# Patient Record
Sex: Female | Born: 1990 | Race: Black or African American | Hispanic: No | Marital: Single | Smoking: Current every day smoker
Health system: Southern US, Community
[De-identification: ages and names within clinical notes are randomized; demographics above are authoritative.]

## PROBLEM LIST (undated history)

## (undated) ENCOUNTER — Ambulatory Visit

## (undated) DIAGNOSIS — F419 Anxiety disorder, unspecified: Secondary | ICD-10-CM

---

## 2008-04-02 ENCOUNTER — Emergency Department: Payer: Self-pay | Admitting: Emergency Medicine

## 2009-03-07 ENCOUNTER — Emergency Department: Payer: Self-pay | Admitting: Emergency Medicine

## 2009-05-18 ENCOUNTER — Emergency Department: Payer: Self-pay | Admitting: Internal Medicine

## 2009-07-14 ENCOUNTER — Ambulatory Visit: Payer: Self-pay | Admitting: Obstetrics & Gynecology

## 2009-12-28 ENCOUNTER — Inpatient Hospital Stay: Payer: Self-pay | Admitting: Obstetrics and Gynecology

## 2010-06-15 ENCOUNTER — Emergency Department: Payer: Self-pay | Admitting: Emergency Medicine

## 2011-02-07 ENCOUNTER — Ambulatory Visit: Payer: Self-pay | Admitting: Internal Medicine

## 2011-05-06 ENCOUNTER — Ambulatory Visit: Payer: Self-pay | Admitting: Internal Medicine

## 2011-09-12 ENCOUNTER — Ambulatory Visit: Payer: Self-pay

## 2011-09-13 ENCOUNTER — Ambulatory Visit: Payer: Self-pay | Admitting: Internal Medicine

## 2012-01-31 ENCOUNTER — Ambulatory Visit: Payer: Self-pay | Admitting: Internal Medicine

## 2012-02-01 ENCOUNTER — Ambulatory Visit: Payer: Self-pay | Admitting: Internal Medicine

## 2012-06-26 ENCOUNTER — Ambulatory Visit: Payer: Self-pay | Admitting: Internal Medicine

## 2012-09-17 ENCOUNTER — Ambulatory Visit: Payer: Self-pay

## 2014-02-07 ENCOUNTER — Emergency Department: Payer: Self-pay | Admitting: Emergency Medicine

## 2014-02-11 ENCOUNTER — Emergency Department: Payer: Self-pay | Admitting: Emergency Medicine

## 2014-03-21 ENCOUNTER — Emergency Department: Payer: Self-pay | Admitting: Emergency Medicine

## 2014-05-30 ENCOUNTER — Emergency Department: Payer: Self-pay | Admitting: Emergency Medicine

## 2014-08-25 ENCOUNTER — Emergency Department: Payer: Self-pay | Admitting: Emergency Medicine

## 2014-11-05 ENCOUNTER — Emergency Department: Payer: Self-pay | Admitting: Emergency Medicine

## 2014-11-22 ENCOUNTER — Emergency Department: Payer: Self-pay | Admitting: Emergency Medicine

## 2015-12-13 ENCOUNTER — Encounter: Payer: Self-pay | Admitting: Emergency Medicine

## 2015-12-13 ENCOUNTER — Emergency Department
Admission: EM | Admit: 2015-12-13 | Discharge: 2015-12-13 | Disposition: A | Discharge status: Home / Self Care | Payer: Self-pay | Attending: Emergency Medicine | Admitting: Emergency Medicine

## 2015-12-13 DIAGNOSIS — F17200 Nicotine dependence, unspecified, uncomplicated: Secondary | ICD-10-CM | POA: Insufficient documentation

## 2015-12-13 DIAGNOSIS — J039 Acute tonsillitis, unspecified: Secondary | ICD-10-CM | POA: Insufficient documentation

## 2015-12-13 DIAGNOSIS — R51 Headache: Secondary | ICD-10-CM | POA: Insufficient documentation

## 2015-12-13 LAB — CBC WITH DIFFERENTIAL/PLATELET
BASOS ABS: 0.1 10*3/uL (ref 0–0.1)
Basophils Relative: 1 %
EOS ABS: 0 10*3/uL (ref 0–0.7)
Eosinophils Relative: 0 %
HCT: 42.3 % (ref 35.0–47.0)
Hemoglobin: 14.3 g/dL (ref 12.0–16.0)
LYMPHS ABS: 1.4 10*3/uL (ref 1.0–3.6)
LYMPHS PCT: 8 %
MCH: 32.3 pg (ref 26.0–34.0)
MCHC: 33.7 g/dL (ref 32.0–36.0)
MCV: 96 fL (ref 80.0–100.0)
Monocytes Absolute: 1.3 10*3/uL — ABNORMAL HIGH (ref 0.2–0.9)
Monocytes Relative: 8 %
NEUTROS ABS: 14.5 10*3/uL — AB (ref 1.4–6.5)
NEUTROS PCT: 83 %
PLATELETS: 319 10*3/uL (ref 150–440)
RBC: 4.41 MIL/uL (ref 3.80–5.20)
RDW: 13.4 % (ref 11.5–14.5)
WBC: 17.3 10*3/uL — AB (ref 3.6–11.0)

## 2015-12-13 LAB — POCT RAPID STREP A: Streptococcus, Group A Screen (Direct): NEGATIVE

## 2015-12-13 LAB — MONONUCLEOSIS SCREEN: MONO SCREEN: NEGATIVE

## 2015-12-13 MED ORDER — AMOXICILLIN 500 MG PO CAPS: 500.0000 mg | ORAL_CAPSULE | Freq: Three times a day (TID) | ORAL | Status: DC

## 2015-12-13 MED ORDER — AMOXICILLIN 500 MG PO CAPS
500.0000 mg | ORAL_CAPSULE | Freq: Once | ORAL | Status: AC
  Administered 2015-12-13: 500 mg via ORAL
  Filled 2015-12-13: qty 1

## 2015-12-13 MED ORDER — ACETAMINOPHEN 500 MG PO TABS
1000.0000 mg | ORAL_TABLET | Freq: Once | ORAL | Status: AC
  Administered 2015-12-13: 1000 mg via ORAL
  Filled 2015-12-13: qty 2

## 2015-12-13 NOTE — Discharge Instructions (Signed)
Tonsillitis Tonsillitis is an infection of the throat. This infection causes the tonsils to become red, tender, and puffy (swollen). Tonsils are groups of tissue at the back of your throat. If bacteria caused your infection, antibiotic medicine will be given to you. Sometimes symptoms of tonsillitis can be relieved with the use of steroid medicine. If your tonsillitis is severe and happens often, you may need to get your tonsils removed (tonsillectomy). HOME CARE   Rest and sleep often.  Drink enough fluids to keep your pee (urine) clear or pale yellow.  While your throat is sore, eat soft or liquid foods like:  Soup.  Ice cream.  Instant breakfast drinks.  Eat frozen ice pops.  Gargle with a warm or cold liquid to help soothe the throat. Gargle with a water and salt mix. Mix 1/4 teaspoon of salt and 1/4 teaspoon of baking soda in 1 cup of water.  Only take medicines as told by your doctor.  If you are given medicines (antibiotics), take them as told. Finish them even if you start to feel better. GET HELP IF:  You have large, tender lumps in your neck.  You have a rash.  You cough up green, yellow-brown, or bloody fluid.  You cannot swallow liquids or food for 24 hours.  You notice that only one of your tonsils is swollen. GET HELP RIGHT AWAY IF:   You throw up (vomit).  You have a very bad headache.  You have a stiff neck.  You have chest pain.  You have trouble breathing or swallowing.  You have bad throat pain, drooling, or your voice changes.  You have bad pain not helped by medicine.  You cannot fully open your mouth.  You have redness, puffiness, or bad pain in the neck.  You have a fever. MAKE SURE YOU:   Understand these instructions.  Will watch your condition.  Will get help right away if you are not doing well or get worse.   This information is not intended to replace advice given to you by your health care provider. Make sure you discuss any  questions you have with your health care provider.   Document Released: 04/25/2008 Document Revised: 11/12/2013 Document Reviewed: 04/26/2013 Elsevier Interactive Patient Education [employer]    Take antibiotics until completely gone in 10 days. Tylenol if needed for headache or throat pain. Follow-up with Dr. Jenne Campus if any continued problems with your throat. You  will need to call and make an appointment.

## 2015-12-13 NOTE — ED Notes (Signed)
Pt comes into the ED via POV c/o sore throat and generalized body aches.  Denies any fevers due to not checking her temperature at home.  States she sees white patches on the back of her throat.

## 2015-12-13 NOTE — ED Notes (Signed)
DC instructions given. Explained follow up procedures with Dr. Jenne Campus. Also informed her that if her throat culture was positive she would be notified by phone otherwise if she did not hear from anyone then the culture is negative.  Explained importance of taking entire course of antibiotics.

## 2015-12-13 NOTE — ED Provider Notes (Signed)
[facility] Emergency Department Provider Note  ____________________________________________  Time seen: Approximately 3:17 PM  I have reviewed the triage vital signs and the nursing notes.   HISTORY  Chief Complaint Sore Throat   HPI Wendy Miles is a 25 y.o. female is here with complaint of sore throat and body aches. Patient was in aware of any fever at home. She states she has seen some white patches in the back of her throat. She is unaware of any exposure to strep throat. Appetite has decreased since she's been sick that still able to drink fluids. Currently she rates her throat pain is 10 out of 10.   History reviewed. No pertinent past medical history.  There are no active problems to display for this patient.   Past Surgical History  Procedure Laterality Date  . Cesarean section      Current Outpatient Rx  Name  Route  Sig  Dispense  Refill  . amoxicillin (AMOXIL) 500 MG capsule   Oral   Take 1 capsule (500 mg total) by mouth 3 (three) times daily.   30 capsule   0     Allergies Review of patient's allergies indicates no known allergies.  No family history on file.  Social History Social History  Substance Use Topics  . Smoking status: Current Every Day Smoker -- 0.50 packs/day  . Smokeless tobacco: None  . Alcohol Use: Yes     Comment: occasionally    Review of Systems Constitutional: Unaware fever/chills Eyes: No visual changes. ENT: Positive sore throat. Cardiovascular: Denies chest pain. Respiratory: Denies shortness of breath. Gastrointestinal:  No nausea, no vomiting.   Musculoskeletal: Negative for back pain. Skin: Negative for rash. Neurological: Positive for headaches, no focal weakness or numbness.  10-point ROS otherwise negative.  ____________________________________________   PHYSICAL EXAM:  VITAL SIGNS: ED Triage Vitals  Enc Vitals Group     BP --      Pulse --      Resp --      Temp --    Temp src --      SpO2 --      Weight --      Height --      Head Cir --      Peak Flow --      Pain Score 12/13/15 1505 10     Pain Loc --      Pain Edu? --      Excl. in GC? --     Constitutional: Alert and oriented. Well appearing and in no acute distress. Eyes: Conjunctivae are normal. PERRL. EOMI. Head: Atraumatic. Nose: No congestion/rhinnorhea. Mouth/Throat: Mucous membranes are moist.  Oropharynx erythematous with bilateral tonsillar exudate. Neck: No stridor.   Hematological/Lymphatic/Immunilogical: Moderate bilateral cervical lymphadenopathy. Cardiovascular: Normal rate, regular rhythm. Grossly normal heart sounds.  Good peripheral circulation. Respiratory: Normal respiratory effort.  No retractions. Lungs CTAB. Gastrointestinal: Soft and nontender. No distention.  Musculoskeletal: Moves upper and lower extremity is without difficulty. Neurologic:  Normal speech and language. No gross focal neurologic deficits are appreciated. No gait instability. Skin:  Skin is warm, dry and intact. No rash noted. Psychiatric: Mood and affect are normal. Speech and behavior are normal.  ____________________________________________   LABS (all labs ordered are listed, but only abnormal results are displayed)  Labs Reviewed  CBC WITH DIFFERENTIAL/PLATELET - Abnormal; Notable for the following:    WBC 17.3 (*)    Neutro Abs 14.5 (*)    Monocytes Absolute 1.3 (*)  All other components within normal limits  CULTURE, GROUP A STREP [facility])  MONONUCLEOSIS SCREEN  POCT RAPID STREP A    PROCEDURES  Procedure(s) performed: None  Critical Care performed: No  ____________________________________________   INITIAL IMPRESSION / ASSESSMENT AND PLAN / ED COURSE  Pertinent labs & imaging results that were available during my care of the patient were reviewed by me and considered in my medical decision making (see chart for details).  My chest was negative as well as strep. Patient  was placed on amoxicillin 500 mg 3 times a day for 10 days and given Tylenol in the emergency room which she had no difficulty swallowing. She'll follow-up with Dr. Jenne Campus if any continued problems. ____________________________________________   FINAL CLINICAL IMPRESSION(S) / ED DIAGNOSES  Final diagnoses:  Exudative tonsillitis      Tommi Rumps, PA-C 12/13/15 1723  Jene Every, MD 12/14/15 2040

## 2015-12-15 LAB — CULTURE, GROUP A STREP (THRC)

## 2016-08-01 ENCOUNTER — Emergency Department
Admission: EM | Admit: 2016-08-01 | Discharge: 2016-08-01 | Disposition: A | Discharge status: Home / Self Care | Payer: Self-pay | Attending: Student in an Organized Health Care Education/Training Program | Admitting: Student in an Organized Health Care Education/Training Program

## 2016-08-01 ENCOUNTER — Encounter: Payer: Self-pay | Admitting: Emergency Medicine

## 2016-08-01 DIAGNOSIS — B9689 Other specified bacterial agents as the cause of diseases classified elsewhere: Secondary | ICD-10-CM

## 2016-08-01 DIAGNOSIS — N760 Acute vaginitis: Secondary | ICD-10-CM | POA: Insufficient documentation

## 2016-08-01 DIAGNOSIS — F17200 Nicotine dependence, unspecified, uncomplicated: Secondary | ICD-10-CM | POA: Insufficient documentation

## 2016-08-01 LAB — CHLAMYDIA/NGC RT PCR (ARMC ONLY)
CHLAMYDIA TR: NOT DETECTED
N GONORRHOEAE: NOT DETECTED

## 2016-08-01 LAB — URINALYSIS COMPLETE WITH MICROSCOPIC (ARMC ONLY)
BACTERIA UA: NONE SEEN
BILIRUBIN URINE: NEGATIVE
GLUCOSE, UA: NEGATIVE mg/dL
HGB URINE DIPSTICK: NEGATIVE
Ketones, ur: NEGATIVE mg/dL
Nitrite: NEGATIVE
Protein, ur: NEGATIVE mg/dL
Specific Gravity, Urine: 1.016 (ref 1.005–1.030)
pH: 6 (ref 5.0–8.0)

## 2016-08-01 LAB — WET PREP, GENITAL
CLUE CELLS WET PREP: NONE SEEN
Sperm: NONE SEEN
Trich, Wet Prep: NONE SEEN
Yeast Wet Prep HPF POC: NONE SEEN

## 2016-08-01 LAB — POCT PREGNANCY, URINE: PREG TEST UR: NEGATIVE

## 2016-08-01 MED ORDER — FLUCONAZOLE 150 MG PO TABS: 150.0000 mg | ORAL_TABLET | Freq: Every day | ORAL | 0 refills | Status: DC

## 2016-08-01 MED ORDER — METRONIDAZOLE 500 MG PO TABS: 500.0000 mg | ORAL_TABLET | Freq: Three times a day (TID) | ORAL | 0 refills | Status: AC

## 2016-08-01 NOTE — Discharge Instructions (Signed)
Will call patient if GC culture is positive.

## 2016-08-01 NOTE — ED Triage Notes (Signed)
Patient presents to the ED with whitish discharge and vaginal discomfort.  Patient denies abdominal pain, denies vomiting, and diarrhea at this time.  Patient states she noticed discharge around 1 month ago and used monistat and found improvement but symptoms have returned.  Patient is in no obvious distress at his time.

## 2016-08-01 NOTE — ED Notes (Signed)
States she developed "deep" vaginal itching several weeks ago  Used Monostat with some relief but now sx's returned with some vaginal discharge

## 2016-08-01 NOTE — ED Provider Notes (Signed)
[facility] Emergency Department Provider Note   ____________________________________________   First MD Initiated Contact with Patient 08/01/16 [phone]     (approximate)  I have reviewed the triage vital signs and the nursing notes.   HISTORY  Chief Complaint Vaginal Discharge and Pelvic Pain    HPI Wendy Miles is a 25 y.o. female patient complaining of one month of whitish vaginal discharge. Patient also stated this vaginal discomfort. Patient denies any flank pain or fever. Patient states she is using over-the-counter Monistat and found mild improvement in symptoms return. Patient denies any high risk sexual activities.Patient rates her pain discomfort as a 2/10. No other palliative measures for this complaint.   History reviewed. No pertinent past medical history.  There are no active problems to display for this patient.   Past Surgical History:  Procedure Laterality Date  . CESAREAN SECTION      Prior to Admission medications   Medication Sig Start Date End Date Taking? Authorizing Provider  amoxicillin (AMOXIL) 500 MG capsule Take 1 capsule (500 mg total) by mouth 3 (three) times daily. 12/13/15   Tommi Rumps, PA-C  fluconazole (DIFLUCAN) 150 MG tablet Take 1 tablet (150 mg total) by mouth daily. 08/01/16   Joni Reining, PA-C  metroNIDAZOLE (FLAGYL) 500 MG tablet Take 1 tablet (500 mg total) by mouth 3 (three) times daily. 08/01/16 08/08/16  Joni Reining, PA-C    Allergies Review of patient's allergies indicates no known allergies.  No family history on file.  Social History Social History  Substance Use Topics  . Smoking status: Current Every Day Smoker    Packs/day: 0.50  . Smokeless tobacco: Never Used  . Alcohol use Yes     Comment: occasionally    Review of Systems Constitutional: No fever/chills Eyes: No visual changes. ENT: No sore throat. Cardiovascular: Denies chest pain. Respiratory: Denies shortness of  breath. Gastrointestinal: No abdominal pain.  No nausea, no vomiting.  No diarrhea.  No constipation. Genitourinary: Negative for dysuria. Musculoskeletal: Negative for back pain. Skin: Negative for rash. Neurological: Negative for headaches, focal weakness or numbness. ____________________________________________   PHYSICAL EXAM:  VITAL SIGNS: ED Triage Vitals [08/01/16 0724]  Enc Vitals Group     BP (!) 457/74     Pulse Rate 67     Resp 18     Temp 98.1 F (36.7 C)     Temp Source Oral     SpO2      Weight 205 lb (93 kg)     Height 5\' 4"  (1.626 m)     Head Circumference      Peak Flow      Pain Score 2     Pain Loc      Pain Edu?      Excl. in GC?     Constitutional: Alert and oriented. Well appearing and in no acute distress. Eyes: Conjunctivae are normal. PERRL. EOMI. Head: Atraumatic. Nose: No congestion/rhinnorhea. Mouth/Throat: Mucous membranes are moist.  Oropharynx non-erythematous. Neck: No stridor.  No cervical spine tenderness to palpation. Hematological/Lymphatic/Immunilogical: No cervical lymphadenopathy. Cardiovascular: Normal rate, regular rhythm. Grossly normal heart sounds.  Good peripheral circulation. Respiratory: Normal respiratory effort.  No retractions. Lungs CTAB. Gastrointestinal: Soft and nontender. No distention. No abdominal bruits. No CVA tenderness. Genitourinary: Chaperoned vaginal exam revealed copious amount of discharge. Patient had negative cervical tenderness with palpation. Musculoskeletal: No lower extremity tenderness nor edema.  No joint effusions. Neurologic:  Normal speech and language. No gross  focal neurologic deficits are appreciated. No gait instability. Skin:  Skin is warm, dry and intact. No rash noted. Psychiatric: Mood and affect are normal. Speech and behavior are normal.  ____________________________________________   LABS (all labs ordered are listed, but only abnormal results are displayed)  Labs Reviewed  WET  PREP, GENITAL - Abnormal; Notable for the following:       Result Value   WBC, Wet Prep HPF POC MANY (*)    All other components within normal limits  URINALYSIS COMPLETEWITH MICROSCOPIC ([facility] ONLY) - Abnormal; Notable for the following:    Color, Urine YELLOW (*)    APPearance CLEAR (*)    Leukocytes, UA 1+ (*)    Squamous Epithelial / LPF 0-5 (*)    All other components within normal limits  CHLAMYDIA/NGC RT PCR ([facility] ONLY)  POCT PREGNANCY, URINE   ____________________________________________  EKG   ____________________________________________  RADIOLOGY   ____________________________________________   PROCEDURES  Procedure(s) performed: None  Procedures  Critical Care performed: No  ____________________________________________   INITIAL IMPRESSION / ASSESSMENT AND PLAN / ED COURSE  Pertinent labs & imaging results that were available during my care of the patient were reviewed by me and considered in my medical decision making (see chart for details).    Clinical Course   Discussed with Peyton NajjarLarry results with patient revealing moderate amount of white blood cells on wet prep but negative for yeast, and clue cells. GC is pending. Patient will be treated for bacterial vaginitis and were notified. GC chlamydia cultures are positive.  ____________________________________________   FINAL CLINICAL IMPRESSION(S) / ED DIAGNOSES  Final diagnoses:  BV (bacterial vaginosis)      NEW MEDICATIONS STARTED DURING THIS VISIT:  New Prescriptions   FLUCONAZOLE (DIFLUCAN) 150 MG TABLET    Take 1 tablet (150 mg total) by mouth daily.   METRONIDAZOLE (FLAGYL) 500 MG TABLET    Take 1 tablet (500 mg total) by mouth 3 (three) times daily.     Note:  This document was prepared using Dragon voice recognition software and may include unintentional dictation errors.    Joni ReiningRonald K Kolin Erdahl, PA-C 08/01/16 1007    Willy EddyPatrick Robinson, MD 08/01/16 1014

## 2017-04-27 ENCOUNTER — Encounter (HOSPITAL_BASED_OUTPATIENT_CLINIC_OR_DEPARTMENT_OTHER): Payer: Self-pay | Admitting: *Deleted

## 2017-04-27 ENCOUNTER — Emergency Department (HOSPITAL_BASED_OUTPATIENT_CLINIC_OR_DEPARTMENT_OTHER)
Admission: EM | Admit: 2017-04-27 | Discharge: 2017-04-27 | Disposition: A | Discharge status: Home / Self Care | Payer: Self-pay | Attending: Emergency Medicine | Admitting: Emergency Medicine

## 2017-04-27 DIAGNOSIS — R05 Cough: Secondary | ICD-10-CM

## 2017-04-27 DIAGNOSIS — J4521 Mild intermittent asthma with (acute) exacerbation: Secondary | ICD-10-CM | POA: Insufficient documentation

## 2017-04-27 DIAGNOSIS — F17200 Nicotine dependence, unspecified, uncomplicated: Secondary | ICD-10-CM | POA: Insufficient documentation

## 2017-04-27 DIAGNOSIS — R059 Cough, unspecified: Secondary | ICD-10-CM

## 2017-04-27 DIAGNOSIS — J45901 Unspecified asthma with (acute) exacerbation: Secondary | ICD-10-CM

## 2017-04-27 DIAGNOSIS — R300 Dysuria: Secondary | ICD-10-CM | POA: Insufficient documentation

## 2017-04-27 LAB — URINALYSIS, ROUTINE W REFLEX MICROSCOPIC
Bilirubin Urine: NEGATIVE
Glucose, UA: NEGATIVE mg/dL
Hgb urine dipstick: NEGATIVE
KETONES UR: 15 mg/dL — AB
NITRITE: NEGATIVE
PH: 8 (ref 5.0–8.0)
Protein, ur: 30 mg/dL — AB
Specific Gravity, Urine: 1.017 (ref 1.005–1.030)

## 2017-04-27 LAB — PREGNANCY, URINE: Preg Test, Ur: NEGATIVE

## 2017-04-27 LAB — URINALYSIS, MICROSCOPIC (REFLEX): RBC / HPF: NONE SEEN RBC/hpf (ref 0–5)

## 2017-04-27 MED ORDER — IPRATROPIUM-ALBUTEROL 0.5-2.5 (3) MG/3ML IN SOLN
3.0000 mL | Freq: Once | RESPIRATORY_TRACT | Status: AC
  Administered 2017-04-27: 3 mL via RESPIRATORY_TRACT
  Filled 2017-04-27: qty 3

## 2017-04-27 MED ORDER — DEXAMETHASONE 4 MG PO TABS
10.0000 mg | ORAL_TABLET | Freq: Once | ORAL | Status: AC
  Administered 2017-04-27: 10 mg via ORAL
  Filled 2017-04-27: qty 1

## 2017-04-27 MED ORDER — AEROCHAMBER PLUS FLO-VU MEDIUM MISC
1.0000 | Freq: Once | Status: AC
  Administered 2017-04-27: 1
  Filled 2017-04-27: qty 1

## 2017-04-27 MED ORDER — ALBUTEROL SULFATE HFA 108 (90 BASE) MCG/ACT IN AERS
2.0000 | INHALATION_SPRAY | RESPIRATORY_TRACT | Status: DC | PRN
  Administered 2017-04-27: 2 via RESPIRATORY_TRACT
  Filled 2017-04-27: qty 6.7

## 2017-04-27 NOTE — ED Notes (Addendum)
Patient took son's QVAR yesterday.  Endorsees remote hx asthma attack as a teen.

## 2017-04-27 NOTE — ED Provider Notes (Signed)
[facility] Provider Note   CSN: [id number] Arrival date & time: 04/27/17  0912     History   Chief Complaint Chief Complaint  Patient presents with  . Cough    HPI Wendy Miles is a 26 y.o. female.  26 year old female who presents with cough. Patient reports a cough beginning yesterday associated with 1 episode of posttussive emesis. No fevers or diarrhea. She denies any significant shortness of breath but does state that in the past she has had problems with asthma. She noticed a small amount of urinary discomfort this morning but no abdominal or back pain. No sick contacts. No therapies tried for her symptoms. No vaginal bleeding or discharge.   The history is provided by the patient.    History reviewed. No pertinent past medical history.  There are no active problems to display for this patient.   Past Surgical History:  Procedure Laterality Date  . CESAREAN SECTION      OB History    No data available       Home Medications    Prior to Admission medications   Not on File    Family History History reviewed. No pertinent family history.  Social History Social History  Substance Use Topics  . Smoking status: Current Every Day Smoker    Packs/day: 0.50  . Smokeless tobacco: Never Used  . Alcohol use Yes     Comment: occasionally     Allergies   Patient has no known allergies.   Review of Systems Review of Systems All other systems reviewed and are negative except that which was mentioned in HPI  Physical Exam Updated Vital Signs BP (!) 127/98 (BP Location: Left Arm)   Pulse 89   Temp 98.2 F (36.8 C) (Oral)   Resp 18   Ht 5\' 4"  (1.626 m)   Wt 95.3 kg (210 lb)   LMP 04/21/2017   SpO2 100%   BMI 36.05 kg/m   Physical Exam  Constitutional: She is oriented to person, place, and time. She appears well-developed and well-nourished. No distress.  HENT:  Head: Normocephalic and atraumatic.  Moist mucous membranes  Eyes:  Conjunctivae are normal. Pupils are equal, round, and reactive to light.  Neck: Neck supple.  Cardiovascular: Normal rate, regular rhythm and normal heart sounds.   No murmur heard. Pulmonary/Chest: Effort normal. No respiratory distress. She has wheezes.  Inspiratory and expiratory wheezes b/l with prolonged expiratory phase and diminished breath sounds  Abdominal: Soft. Bowel sounds are normal. She exhibits no distension. There is no tenderness.  Musculoskeletal: She exhibits no edema.  Neurological: She is alert and oriented to person, place, and time.  Fluent speech  Skin: Skin is warm and dry. No rash noted.  Psychiatric: She has a normal mood and affect. Judgment normal.  Nursing note and vitals reviewed.    ED Treatments / Results  Labs (all labs ordered are listed, but only abnormal results are displayed) Labs Reviewed  URINALYSIS, ROUTINE W REFLEX MICROSCOPIC - Abnormal; Notable for the following:       Result Value   APPearance CLOUDY (*)    Ketones, ur 15 (*)    Protein, ur 30 (*)    Leukocytes, UA SMALL (*)    All other components within normal limits  URINALYSIS, MICROSCOPIC (REFLEX) - Abnormal; Notable for the following:    Bacteria, UA MANY (*)    Squamous Epithelial / LPF 6-30 (*)    All other components within normal limits  URINE CULTURE  PREGNANCY, URINE  GC/CHLAMYDIA PROBE AMP ([place]    EKG  EKG Interpretation None       Radiology No results found.  Procedures Procedures (including critical care time)  Medications Ordered in ED Medications  dexamethasone (DECADRON) tablet 10 mg (10 mg Oral Given 04/27/17 1035)  ipratropium-albuterol (DUONEB) 0.5-2.5 (3) MG/3ML nebulizer solution 3 mL (3 mLs Nebulization Given 04/27/17 1018)  AEROCHAMBER PLUS FLO-VU MEDIUM MISC 1 each (1 each Other Given 04/27/17 1019)     Initial Impression / Assessment and Plan / ED Course  I have reviewed the triage vital signs and the nursing  notes.  Pertinent labs & imaging results that were available during my care of the patient were reviewed by me and considered in my medical decision making (see chart for details).     PT w/ cough since yesterday, she did have wheezing on exam and reported history of asthma symptoms. No respiratory distress and normal O2 saturation on room air, appropriate work of breathing. She's had no fevers or significant productive cough and I do not feel she needs any chest x-ray at this time as her symptoms are consistent with asthma exacerbation, possibly triggered by URI. Gave Decadron, breathing treatment as well as albuterol inhaler to use at home. UA shows small leukocytes, bacteria but no significant WBCs and negative nitrites. Added urine culture. Patient well-appearing with improved air movement on repeat exam. Reviewed supportive care instructions as well as return precautions. Patient voiced understanding and was discharged in satisfactory condition.  Final Clinical Impressions(s) / ED Diagnoses   Final diagnoses:  Mild asthma with exacerbation, unspecified whether persistent  Cough    New Prescriptions There are no discharge medications for this patient.    Little, Ambrose Finlandachel Morgan, MD 04/27/17 [phone]

## 2017-04-27 NOTE — ED Triage Notes (Signed)
Pt reports cough and congestion x yesterday, denies fevers.

## 2017-04-28 LAB — GC/CHLAMYDIA PROBE AMP (~~LOC~~) NOT AT ARMC
Chlamydia: NEGATIVE
Neisseria Gonorrhea: NEGATIVE

## 2017-04-28 LAB — URINE CULTURE

## 2017-07-27 ENCOUNTER — Encounter (HOSPITAL_BASED_OUTPATIENT_CLINIC_OR_DEPARTMENT_OTHER): Payer: Self-pay | Admitting: *Deleted

## 2017-07-27 ENCOUNTER — Emergency Department (HOSPITAL_BASED_OUTPATIENT_CLINIC_OR_DEPARTMENT_OTHER)
Admission: EM | Admit: 2017-07-27 | Discharge: 2017-07-27 | Disposition: A | Discharge status: Home / Self Care | Payer: Self-pay | Attending: Emergency Medicine | Admitting: Emergency Medicine

## 2017-07-27 DIAGNOSIS — B9689 Other specified bacterial agents as the cause of diseases classified elsewhere: Secondary | ICD-10-CM

## 2017-07-27 DIAGNOSIS — F17200 Nicotine dependence, unspecified, uncomplicated: Secondary | ICD-10-CM | POA: Insufficient documentation

## 2017-07-27 DIAGNOSIS — N760 Acute vaginitis: Secondary | ICD-10-CM | POA: Insufficient documentation

## 2017-07-27 LAB — WET PREP, GENITAL
SPERM: NONE SEEN
TRICH WET PREP: NONE SEEN
YEAST WET PREP: NONE SEEN

## 2017-07-27 LAB — URINALYSIS, ROUTINE W REFLEX MICROSCOPIC
Bilirubin Urine: NEGATIVE
GLUCOSE, UA: NEGATIVE mg/dL
Hgb urine dipstick: NEGATIVE
Ketones, ur: NEGATIVE mg/dL
LEUKOCYTES UA: NEGATIVE
Nitrite: NEGATIVE
PH: 7 (ref 5.0–8.0)
PROTEIN: NEGATIVE mg/dL
SPECIFIC GRAVITY, URINE: 1.015 (ref 1.005–1.030)

## 2017-07-27 LAB — PREGNANCY, URINE: Preg Test, Ur: NEGATIVE

## 2017-07-27 MED ORDER — METRONIDAZOLE 500 MG PO TABS: 500.0000 mg | ORAL_TABLET | Freq: Two times a day (BID) | ORAL | 0 refills | Status: DC

## 2017-07-27 MED ORDER — LIDOCAINE HCL 1 % IJ SOLN
INTRAMUSCULAR | Status: AC
  Administered 2017-07-27: 0.9 mL
  Filled 2017-07-27: qty 10

## 2017-07-27 MED ORDER — AZITHROMYCIN 250 MG PO TABS
1000.0000 mg | ORAL_TABLET | Freq: Once | ORAL | Status: AC
  Administered 2017-07-27: 1000 mg via ORAL
  Filled 2017-07-27: qty 4

## 2017-07-27 MED ORDER — CEFTRIAXONE SODIUM 250 MG IJ SOLR
250.0000 mg | Freq: Once | INTRAMUSCULAR | Status: AC
Start: 2017-07-27 — End: 2017-07-27
  Administered 2017-07-27: 250 mg via INTRAMUSCULAR
  Filled 2017-07-27: qty 250

## 2017-07-27 NOTE — ED Triage Notes (Signed)
Pt reports lower abd pain radiating to back with white-colored vaginal discharge x3days. Denies fever, n/v/d, urinary symptoms.

## 2017-07-27 NOTE — ED Provider Notes (Signed)
[facility] Provider Note   CSN: [id number] Arrival date & time: 07/27/17  [id number]     History   Chief Complaint Chief Complaint  Patient presents with  . Vaginal Discharge    HPI Wendy Miles is a 26 y.o. female.  Patient is a 26 year old female who presents with lower abdominal pain with vaginal discharge. She states it started about 3 days ago. Her pain is in the lower abdomen radiating to the left side. She has a white vaginal discharge. No nausea or vomiting. No fevers. No urinary symptoms. Her last menstrual cycle was on August 17. She has remote history of Chlamydia in the past. She is sexually active.      History reviewed. No pertinent past medical history.  There are no active problems to display for this patient.   Past Surgical History:  Procedure Laterality Date  . CESAREAN SECTION      OB History    No data available       Home Medications    Prior to Admission medications   Medication Sig Start Date End Date Taking? Authorizing Provider  metroNIDAZOLE (FLAGYL) 500 MG tablet Take 1 tablet (500 mg total) by mouth 2 (two) times daily. One po bid x 7 days 07/27/17   Rolan BuccoBelfi, Boruch Manuele, MD    Family History No family history on file.  Social History Social History  Substance Use Topics  . Smoking status: Current Every Day Smoker    Packs/day: 0.50  . Smokeless tobacco: Never Used  . Alcohol use Yes     Comment: occasionally     Allergies   Bee venom   Review of Systems Review of Systems  Constitutional: Negative for chills, diaphoresis, fatigue and fever.  HENT: Negative for congestion, rhinorrhea and sneezing.   Eyes: Negative.   Respiratory: Negative for cough, chest tightness and shortness of breath.   Cardiovascular: Negative for chest pain and leg swelling.  Gastrointestinal: Negative for abdominal pain, blood in stool, diarrhea, nausea and vomiting.  Genitourinary: Positive for vaginal discharge. Negative for difficulty  urinating, flank pain, frequency, hematuria and vaginal bleeding.  Musculoskeletal: Negative for arthralgias and back pain.  Skin: Negative for rash.  Neurological: Negative for dizziness, speech difficulty, weakness, numbness and headaches.     Physical Exam Updated Vital Signs BP 117/87 (BP Location: Left Arm)   Pulse 78   Temp 98.5 F (36.9 C) (Oral)   Resp 16   Ht 5\' 4"  (1.626 m)   Wt 95.3 kg (210 lb)   LMP 07/07/2017 (Exact Date)   SpO2 100%   BMI 36.05 kg/m   Physical Exam  Constitutional: She is oriented to person, place, and time. She appears well-developed and well-nourished.  HENT:  Head: Normocephalic and atraumatic.  Eyes: Pupils are equal, round, and reactive to light.  Neck: Normal range of motion. Neck supple.  Cardiovascular: Normal rate, regular rhythm and normal heart sounds.   Pulmonary/Chest: Effort normal and breath sounds normal. No respiratory distress. She has no wheezes. She has no rales. She exhibits no tenderness.  Abdominal: Soft. Bowel sounds are normal. There is tenderness. There is no rebound and no guarding.  Mild tenderness to the suprapubic and left lower abdomen  Genitourinary:  Genitourinary Comments: Thick white vaginal discharge, no cervical motion tenderness, no adnexal tenderness  Musculoskeletal: Normal range of motion. She exhibits no edema.  Lymphadenopathy:    She has no cervical adenopathy.  Neurological: She is alert and oriented to person, place, and time.  Skin: Skin is warm and dry. No rash noted.  Psychiatric: She has a normal mood and affect.     ED Treatments / Results  Labs (all labs ordered are listed, but only abnormal results are displayed) Labs Reviewed  WET PREP, GENITAL - Abnormal; Notable for the following:       Result Value   Clue Cells Wet Prep HPF POC PRESENT (*)    WBC, Wet Prep HPF POC MANY (*)    All other components within normal limits  URINALYSIS, ROUTINE W REFLEX MICROSCOPIC  PREGNANCY, URINE    RPR  HIV ANTIBODY (ROUTINE TESTING)  GC/CHLAMYDIA PROBE AMP ([place]) NOT AT [facility]    EKG  EKG Interpretation None       Radiology No results found.  Procedures Procedures (including critical care time)  Medications Ordered in ED Medications  cefTRIAXone (ROCEPHIN) injection 250 mg (not administered)  azithromycin (ZITHROMAX) tablet 1,000 mg (not administered)     Initial Impression / Assessment and Plan / ED Course  I have reviewed the triage vital signs and the nursing notes.  Pertinent labs & imaging results that were available during my care of the patient were reviewed by me and considered in my medical decision making (see chart for details).     Patient is a 26 year old female who presents with vaginal discharge. She has no significant pain on exam which we more consistent with ovarian torsion or cyst. Her pregnancy test is negative. She has evidence of bacterial vaginosis. She will be treated with Flagyl. She was also treated with Rocephin and Zithromax for possible STDs. She was encouraged to follow-up with the women's outpatient clinic if her symptoms are not improving. Return precautions were given.  Final Clinical Impressions(s) / ED Diagnoses   Final diagnoses:  BV (bacterial vaginosis)    New Prescriptions New Prescriptions   METRONIDAZOLE (FLAGYL) 500 MG TABLET    Take 1 tablet (500 mg total) by mouth 2 (two) times daily. One po bid x 7 days     Rolan Bucco, MD 07/27/17 1023

## 2017-07-28 LAB — RPR: RPR Ser Ql: NONREACTIVE

## 2017-07-28 LAB — GC/CHLAMYDIA PROBE AMP (~~LOC~~) NOT AT ARMC
CHLAMYDIA, DNA PROBE: NEGATIVE
Neisseria Gonorrhea: NEGATIVE

## 2017-07-28 LAB — HIV ANTIBODY (ROUTINE TESTING W REFLEX): HIV SCREEN 4TH GENERATION: NONREACTIVE

## 2018-05-13 ENCOUNTER — Encounter (HOSPITAL_COMMUNITY): Payer: Self-pay

## 2018-05-13 ENCOUNTER — Inpatient Hospital Stay (HOSPITAL_COMMUNITY)
Admission: AD | Admit: 2018-05-13 | Discharge: 2018-05-13 | Disposition: A | Discharge status: Home / Self Care | Payer: Self-pay | Source: Ambulatory Visit | Attending: Obstetrics and Gynecology | Admitting: Obstetrics and Gynecology

## 2018-05-13 DIAGNOSIS — N760 Acute vaginitis: Secondary | ICD-10-CM | POA: Insufficient documentation

## 2018-05-13 DIAGNOSIS — F17200 Nicotine dependence, unspecified, uncomplicated: Secondary | ICD-10-CM | POA: Insufficient documentation

## 2018-05-13 DIAGNOSIS — B9689 Other specified bacterial agents as the cause of diseases classified elsewhere: Secondary | ICD-10-CM

## 2018-05-13 DIAGNOSIS — R109 Unspecified abdominal pain: Secondary | ICD-10-CM | POA: Insufficient documentation

## 2018-05-13 DIAGNOSIS — Z3202 Encounter for pregnancy test, result negative: Secondary | ICD-10-CM | POA: Insufficient documentation

## 2018-05-13 HISTORY — DX: Anxiety disorder, unspecified: F41.9

## 2018-05-13 LAB — POCT PREGNANCY, URINE: PREG TEST UR: NEGATIVE

## 2018-05-13 LAB — WET PREP, GENITAL
SPERM: NONE SEEN
Trich, Wet Prep: NONE SEEN
Yeast Wet Prep HPF POC: NONE SEEN

## 2018-05-13 LAB — URINALYSIS, ROUTINE W REFLEX MICROSCOPIC
BILIRUBIN URINE: NEGATIVE
GLUCOSE, UA: NEGATIVE mg/dL
HGB URINE DIPSTICK: NEGATIVE
Ketones, ur: NEGATIVE mg/dL
Leukocytes, UA: NEGATIVE
Nitrite: NEGATIVE
Protein, ur: NEGATIVE mg/dL
SPECIFIC GRAVITY, URINE: 1.024 (ref 1.005–1.030)
pH: 6 (ref 5.0–8.0)

## 2018-05-13 MED ORDER — METRONIDAZOLE 500 MG PO TABS: 500.0000 mg | ORAL_TABLET | Freq: Two times a day (BID) | ORAL | 0 refills | Status: DC

## 2018-05-13 NOTE — MAU Note (Signed)
Intermittent lower abdominal pain for the past week, pain is worse with orgasm  Increased urination  No vaginal bleeding or discharge  LMP 04/21/2018

## 2018-05-13 NOTE — MAU Provider Note (Signed)
History     CSN: [id number]  Arrival date and time: 05/13/18 1135   First Provider Initiated Contact with Patient 05/13/18 1300      Chief Complaint  Patient presents with  . Abdominal Pain  . Urinary Frequency   HPI  Wendy Miles is a 27 y.o. non pregnant female who presents with abdominal cramping and increased urinary frequency. Symptoms began over a week ago. Reports abdominal cramping in lower abdomen and suprapubic area. Pain is intermittent; can't tell how often or how many times per day she has pain. Rates pain 7/10 when it occurs. Has not taken anything for the pain. Nothing makes pain better or worse. Has also noticed increased urinary frequency with the pain. Denies dysuria, hematuria, flank pain, fever/chills, vaginal bleeding, or vaginal discharge. She does not have a PCP. She does not use contraception.  Past Medical History:  Diagnosis Date  . Anxiety     Past Surgical History:  Procedure Laterality Date  . CESAREAN SECTION      Family History  Problem Relation Age of Onset  . Diabetes Mother   . Heart disease Maternal Grandfather     Social History   Tobacco Use  . Smoking status: Current Every Day Smoker    Packs/day: 0.50  . Smokeless tobacco: Never Used  Substance Use Topics  . Alcohol use: Yes    Comment: rarely  . Drug use: Yes    Types: Marijuana    Comment: rare    Allergies:  Allergies  Allergen Reactions  . Bee Venom Hives    Medications Prior to Admission  Medication Sig Dispense Refill Last Dose  . metroNIDAZOLE (FLAGYL) 500 MG tablet Take 1 tablet (500 mg total) by mouth 2 (two) times daily. One po bid x 7 days 14 tablet 0     Review of Systems  Constitutional: Negative.   Gastrointestinal: Positive for abdominal pain. Negative for diarrhea, nausea and vomiting.  Genitourinary: Positive for frequency. Negative for dysuria, flank pain, hematuria, vaginal bleeding and vaginal discharge.   Physical Exam   Blood pressure  119/84, pulse 75, temperature 98.3 F (36.8 C), temperature source Oral, resp. rate 18, height 5\' 4"  (1.626 m), weight 210 lb (95.3 kg), last menstrual period 04/21/2018.  Physical Exam  Nursing note and vitals reviewed. Constitutional: She is oriented to person, place, and time. She appears well-developed and well-nourished. No distress.  HENT:  Head: Normocephalic and atraumatic.  Eyes: Conjunctivae are normal. Right eye exhibits no discharge. Left eye exhibits no discharge. No scleral icterus.  Neck: Normal range of motion.  Respiratory: Effort normal. No respiratory distress.  GI: Soft. Bowel sounds are normal. She exhibits no distension. There is no tenderness. There is no rebound and no guarding.  Genitourinary: Uterus normal. Cervix exhibits no motion tenderness and no friability. Right adnexum displays no mass and no tenderness. Left adnexum displays no mass and no tenderness. No bleeding in the vagina. Vaginal discharge found.  Neurological: She is alert and oriented to person, place, and time.  Skin: Skin is warm and dry. She is not diaphoretic.  Psychiatric: She has a normal mood and affect. Her behavior is normal. Judgment and thought content normal.    MAU Course  Procedures Results for orders placed or performed during the hospital encounter of 05/13/18 (from the past 24 hour(s))  Urinalysis, Routine w reflex microscopic     Status: None   Collection Time: 05/13/18 12:03 PM  Result Value Ref Range   Color, Urine  YELLOW YELLOW   APPearance CLEAR CLEAR   Specific Gravity, Urine 1.024 1.005 - 1.030   pH 6.0 5.0 - 8.0   Glucose, UA NEGATIVE NEGATIVE mg/dL   Hgb urine dipstick NEGATIVE NEGATIVE   Bilirubin Urine NEGATIVE NEGATIVE   Ketones, ur NEGATIVE NEGATIVE mg/dL   Protein, ur NEGATIVE NEGATIVE mg/dL   Nitrite NEGATIVE NEGATIVE   Leukocytes, UA NEGATIVE NEGATIVE  Pregnancy, urine POC     Status: None   Collection Time: 05/13/18 12:05 PM  Result Value Ref Range    Preg Test, Ur NEGATIVE NEGATIVE  Wet prep, genital     Status: Abnormal   Collection Time: 05/13/18  1:18 PM  Result Value Ref Range   Yeast Wet Prep HPF POC NONE SEEN NONE SEEN   Trich, Wet Prep NONE SEEN NONE SEEN   Clue Cells Wet Prep HPF POC PRESENT (A) NONE SEEN   WBC, Wet Prep HPF POC FEW (A) NONE SEEN   Sperm NONE SEEN     MDM UPT negative VSS, NAD GC/CT & wet prep  Assessment and Plan  A: 1. Abdominal pain in female   2. Pregnancy examination or test, negative result   3. BV (bacterial vaginosis)    P: Discharge home Rx flagyl GC/CT pending Establish with PCP  Judeth Horn 05/13/2018, 1:00 PM

## 2018-05-13 NOTE — Discharge Instructions (Signed)
[place] Resource Guide (Revised August 2014)    Insufficient Money for Medicine:           United Way: call "211"   MAP Program at [facility] [place] [phone] or [place] [phone]            No Primary Care Doctor:  To locate a primary care doctor that accepts your insurance or provides certain services:           [place] [facility]: [phone]           Physician Referral Service: 1-800-533-3463 ask for "My [place]" If no insurance, you need to see if you qualify for GCCN "orange card", call to set      up appointment for eligibility/enrollment at [phone] or [phone] or visit [place] [facility] ([address], [place] and [address] -[place]) to meet with a GCCN enrollment specialist.  Agencies that provide inexpensive (sliding fee scale) medical care:      [facility] - [facility] at [place] - [phone]    [facility]  -  [facility] [place] [facility] - [phone]    [facility] - [phone]    [facility] - [phone]    [facility] - [phone]    [facility] - [phone] [facility] - [facility] [place] - [phone] [facility] - [facility] [place] - [phone] [facility]: [phone]  [facility] Clinic: [phone]  [facility]anned Parenthood: [phone]  [facility] - [phone]    Medicaid-accepting [place] Providers:           [facility] - [phone] (No Family Planning accepted)          [address], [phone], Mon-Fri 9am-5pm          [facility] - [phone], [address], Mon-Thursday 8am-5pm, Fri 8am-noon [facility] - [phone]          [address],  [address], Mon-Fri 7:30am-4:30pm          [facility] - [phone]          [address] [place] [address], Mon-Fri 8am-5pm          [facility] - [phone]          [address]          Only accepts [place] Access Medicaid patients after they have their name applied to their card  Self Pay (no insurance) in [place]:           Sickle Cell Patients:  [address], [phone] [facility]: [address], [place] [phone]       [facility] [address], [place] [phone]  [facility]: [address], [phone]          [facility]           [address], [phone] [facility] [address], [phone]           [facility]             [address], [place] [phone]        [facility] - [address]           [phone], Mon-Fri 9am-7pm, Sat 9am-1pm          [facility] - [facility] [place]          [address] [place] [address], [phone]          [facility] - [place]           [address], [phone] [facility] - [facility] [place] [address], [place] [phone]          [facility]           [address] [place] [address], [phone]  [facility] - [facility]  [address], [phone] [facility] - [facility] [address], [phone]  Dr. Osei-Bonsu           [address], [place], [phone]          [facility]           [address], [phone]          [facility]             [address], [phone]          [facility]           [address], [phone], 1st & 3rd Saturday every month, 10am-1pm OTHERS:  [facility]  (Imm[facility] Access Clinic Only)  [phone] (Thursday only) Strategies for finding a Primary Care Provider:  1) Find a Doctor and Pay Out of Pocket  Although you won't have to find out who is covered by your insurance plan, it is a good idea to ask around and get recommendations. You will then need to call the office and see if the doctor you have chosen will accept you as a new patient and what types of options they offer for patients who are self-pay. Some doctors offer discounts or will set up payment plans for their patients who do not have insurance, but you will need to ask so you aren't surprised when you get to your appointment.  2) Contact [facility] - To see if you qualify for "orange card" access to healthcare safety net providers.  Call for appointment for eligibility/enrollment at [phone] or [phone]. (Uninsured, 0-200% FPL, qualifying info)  Applicants for GCCN are first required to see if they are eligible to enroll in the ACA Marketplace before enrolling in GCCN (and get an exemption if they are not).  GCCN Criteria for acceptance is:  Proof of ACA Marketing exemption - form or documentation  Valid photo ID (driver's license, state identification card, passport, home country ID)  Proof of [place] residency (e.g. driver's license, lease/landlord information, pay stubs with address, utility bill, bank statement, etc.)  Proof of income (1040, last year's tax return, W2, 4 current pay stubs, other income proof)  Proof of assets (current bank statement + 3 most recent, disability paperwork, life insurance info, tax value on autos, etc.)  3) Contact Your Local Health Department  Not all health departments have doctors that can see patients for sick visits,   but many do, so it is worth a call to see if yours does. If you don't know where your local health  department is, you can check in your phone book. The CDC also has a tool to help you locate your state's health department, and many state websites also have listings of all of their local health departments.  4) Find a Walk-in Clinic  If your illness is not likely to be very severe or complicated, you may want to try a walk in clinic. These are popping up all over the country in pharmacies, drugstores, and shopping centers. They're usually staffed by nurse practitioners or physician assistants that have been trained to treat common illnesses and complaints. They're usually fairly quick and inexpensive. However, if you have serious medical issues or chronic medical problems, these are probably not your best option   STD Testing:           [place] [facility] [place], STD Clinic           [address], [place], phone [phone] or [phone]           Monday - Friday, call for an appointment          [place] [facility] [place], [facility]D Clinic           [address], [place], phone [phone] or [phone]           Monday - Friday, call for an appointment Abuse/Neglect:           [place] Child Abuse Hotline: 641-3795           [place] Child Abuse Hotline: 800-378-5315 (After Hours) Emergency Shelter:  [facility] (336) 271-5959  [facility] [place]- (336) 881-5415  [facility] [place] - (336) 235-0330  [facility] - (336) 375-1332 (ages 11-17)  Homeless Day Shelter @ [facility] - (336) 332-0824   Mammograms - Free at [facility] [place] - 614-3233  Maternity Homes:           [facility]: (336) 275-9566   (Homeless mother with children)          [facility]: (704) 372-4663 (Mothers only) [facility]: (336) 370-9232 (Pregnant 16-21 years old) [facility] -(336) 641-7777  [place] Resources  [facility] - [facility] [address], [place] ([phone]) [phone]          [facility] [place]           [address], [place]           [phone]          United Way           [address], [place]           [phone]          [place] [facility]           [address], [place]           [phone]          [place] [facility]           [phone]          [place] [facility] - [facility]           1-888-581-9988            [facility] [place]           [address]           [phone], Insurance          [place] Child Abuse Hotline           (336) 342-1394           (336) 342-3537 (After Hours)  Behavioral Health Services /Substance Abuse Resources:           [facility]: 882-2125           [facility]: [phone]          [facility]: (336) 285-9073  Narcotics Helpline - 1-866-375-1272          [facility]: (336) 845-3988           Residential & Outpatient Substance Abuse Program - [facility]: 800-659-3381 [employer]  [facility] - (336) 285-2605 Psychological Services:          [facility]: [phone]  [facility]: 1-877-626-1772          [facility]           [address] [place] [address], [place]           ACCESS LINE: 1-800-256-2452     (24 Hour) Mobile Crisis:  [place]:  [facility] - [place] (336) 370-4264 [facility] - [place] (800) 451-9682 Walk In Crisis Services       [facility] - [address] [place] [address] - [place]  (336)676-6905       [facility] - (336)832-9600 or (336) 832-9700  [facility] - [address] - [place] (336)[phone]  [facility] [place] [facility] - [address], [place] (336) [phone]   Dental Assistance:   If unable to pay or uninsured, contact: [place] [facility] to become qualified for the adult dental clinic. Patient must be enrolled in GCCN (uninsured, 0-200% FPL, qualifying info).  Enroll in GCCN first, then see Primary Care Physician assigned to you, the PCP makes a dental referral. [facility] will receive referral and contacts patient for appointment.  Patients with Medicaid           [address], [phone]  [facility] (Children up to 20 + Pregnant Women) - (336) [phone]  [facility] - [address] - [address] (336) [phone]  If unable to pay, or uninsured: contact [place] [facility] ([phone] in [place] - (Children only + Pregnant Women), [phone] in [place]- Children only) to become qualified for the adult dental clinic  Must see if eligible to enroll in ACA Health Insurance Marketplace before enrolling into the GCCN (exemption required) (1-855-733-3711 for an appointment)  www.healthcare.gov;   1-800-318-2596.  If not eligible for ACA, then go by Department of Health and Human Services to see if eligible for "orange card."  [address], [place] and [address]- [place].  Once you get an orange card, you will have a Primary Care home who will then refer you to dental if needed.     Other Low-Cost Community Dental Services:   [facility] - [phone] (ext [phone])   [address] [place] [address]  Dr. Civils - [phone]   [address]    [employer] - [phone]   [address]           [facility]           [address], [place], [place], [address]           [phone], Ext. [phone]           2nd and 4th Thursday of the month at 6:30am (Simple extractions only - no wisdom teeth or surgery) First come/First serve -First 10 clients served           [facility] ([place] and [place] residents only)          [address], [place], [place], [address]            [phone]                    [place] [facility]           [phone]          [place] [facility]          [phone]         [facility] - [facility]          [phone]   Transportation Options:  Ambulance - 911 - $250-$700 per ride Family Member to accompany patient (if stable) - [place] [employer] - (336) [phone]  PART - (336) [phone]  Taxi - (336) 272-5112 - [place]  [facility] - (336) 333-6589 (Application required)  [place] [facility] - (336) [phone]  

## 2018-05-14 LAB — GC/CHLAMYDIA PROBE AMP (~~LOC~~) NOT AT ARMC
CHLAMYDIA, DNA PROBE: NEGATIVE
Neisseria Gonorrhea: NEGATIVE

## 2018-05-22 ENCOUNTER — Other Ambulatory Visit: Payer: Self-pay | Admitting: Advanced Practice Midwife

## 2018-05-22 DIAGNOSIS — B9689 Other specified bacterial agents as the cause of diseases classified elsewhere: Secondary | ICD-10-CM

## 2018-05-22 DIAGNOSIS — N760 Acute vaginitis: Principal | ICD-10-CM

## 2018-05-22 MED ORDER — METRONIDAZOLE 500 MG PO TABS: 500.0000 mg | ORAL_TABLET | Freq: Two times a day (BID) | ORAL | 0 refills | Status: DC

## 2018-05-22 NOTE — Progress Notes (Signed)
Order for Flagyl was ruined. Called requesting new one-->sent to pharmacy.

## 2019-04-19 ENCOUNTER — Emergency Department (HOSPITAL_COMMUNITY)
Admission: EM | Admit: 2019-04-19 | Discharge: 2019-04-19 | Disposition: A | Discharge status: Home / Self Care | Payer: Self-pay | Attending: Emergency Medicine | Admitting: Emergency Medicine

## 2019-04-19 ENCOUNTER — Other Ambulatory Visit: Payer: Self-pay

## 2019-04-19 ENCOUNTER — Encounter (HOSPITAL_COMMUNITY): Payer: Self-pay | Admitting: Emergency Medicine

## 2019-04-19 DIAGNOSIS — F17210 Nicotine dependence, cigarettes, uncomplicated: Secondary | ICD-10-CM | POA: Insufficient documentation

## 2019-04-19 DIAGNOSIS — L02411 Cutaneous abscess of right axilla: Secondary | ICD-10-CM | POA: Insufficient documentation

## 2019-04-19 MED ORDER — LIDOCAINE-EPINEPHRINE-TETRACAINE (LET) SOLUTION
3.0000 mL | Freq: Once | NASAL | Status: AC
  Administered 2019-04-19: 19:00:00 3 mL via TOPICAL
  Filled 2019-04-19: qty 3

## 2019-04-19 MED ORDER — HYDROCODONE-ACETAMINOPHEN 5-325 MG PO TABS
1.0000 | ORAL_TABLET | Freq: Once | ORAL | Status: AC
Start: 2019-04-19 — End: 2019-04-19
  Administered 2019-04-19: 1 via ORAL
  Filled 2019-04-19: qty 1

## 2019-04-19 MED ORDER — LIDOCAINE-EPINEPHRINE (PF) 2 %-1:200000 IJ SOLN
10.0000 mL | Freq: Once | INTRAMUSCULAR | Status: AC
  Administered 2019-04-19: 10 mL via INTRADERMAL
  Filled 2019-04-19: qty 20

## 2019-04-19 NOTE — Discharge Instructions (Signed)
1. Medications: You can alternate 600 mg of ibuprofen and [id number] mg of Tylenol every 3-6 hours as needed for pain. Do not exceed 4000 mg of Tylenol daily.  Take ibuprofen with food to avoid upset stomach issues. 2. Treatment: rest, drink plenty of fluids, use warm compresses, flush abscess with warm water several times per day 3. Follow Up: Return to the emergency department in 2 days for wound recheck if your symptoms have not improved.  If your symptoms are improving, you can remove the packing that was placed while in the shower.  Please return to the ER sooner for fevers, chills, nausea, vomiting or other signs of infection

## 2019-04-19 NOTE — ED Provider Notes (Signed)
[facility] EMERGENCY DEPARTMENT Provider Note   CSN: [id number] Arrival date & time: 04/19/19  1758    History   Chief Complaint Chief Complaint  Patient presents with  . Abscess    HPI Wendy Miles is a 28 y.o. female with history of anxiety presents for evaluation of acute onset, progressively worsening pain and swelling to the right axilla for 4 days.  Reports history of abscesses "all my life" and thinks she has hidradenitis suppurativa.  On Sunday 5 days ago she developed an area of swelling and tenderness to the midline near the right breast but reports that this responded to Epsom salt soaks and has been spontaneously draining.  She reports that it is not of any concern to her and thinks that it is healing well.  2 days later she began developing pain and swelling to the right axilla which has not been responsive to Epsom soaks and Tylenol.  Denies fever, nausea, vomiting.  He has had I&Ds for these in the past.     The history is provided by the patient.    Past Medical History:  Diagnosis Date  . Anxiety     There are no active problems to display for this patient.   Past Surgical History:  Procedure Laterality Date  . CESAREAN SECTION       OB History    Gravida  2   Para  1   Term  1   Preterm      AB  1   Living  1     SAB      TAB      Ectopic  1   Multiple      Live Births               Home Medications    Prior to Admission medications   Medication Sig Start Date End Date Taking? Authorizing Provider  metroNIDAZOLE (FLAGYL) 500 MG tablet Take 1 tablet (500 mg total) by mouth 2 (two) times daily. 05/22/18   Dorathy Kinsman, CNM    Family History Family History  Problem Relation Age of Onset  . Diabetes Mother   . Heart disease Maternal Grandfather     Social History Social History   Tobacco Use  . Smoking status: Current Every Day Smoker    Packs/day: 0.50  . Smokeless tobacco: Never Used   Substance Use Topics  . Alcohol use: Yes    Comment: rarely  . Drug use: Yes    Types: Marijuana    Comment: rare     Allergies   Bee venom   Review of Systems Review of Systems  Constitutional: Negative for chills and fever.  Gastrointestinal: Negative for nausea and vomiting.  Skin: Positive for color change.     Physical Exam Updated Vital Signs BP 104/85 (BP Location: Right Arm)   Pulse 96   Temp 98.7 F (37.1 C) (Oral)   Resp 17   Ht  (1.626 m)   Wt 102.1 kg   LMP 03/24/2019   SpO2 94%   BMI 38.62 kg/m   Physical Exam Vitals signs and nursing note reviewed.  Constitutional:      General: She is not in acute distress.    Appearance: She is well-developed.     Comments: Tearful, anxious appearing  HENT:     Head: Normocephalic and atraumatic.  Eyes:     General:        Right eye: No discharge.  Left eye: No discharge.     Conjunctiva/sclera: Conjunctivae normal.  Neck:     Vascular: No JVD.     Trachea: No tracheal deviation.  Cardiovascular:     Rate and Rhythm: Normal rate and regular rhythm.     Comments: 1.5cm lesion draining serous fluid, no tenderness to palpation, no surrounding erythema or induration. Pulmonary:     Effort: Pulmonary effort is normal.  Abdominal:     General: There is no distension.  Skin:    General: Skin is warm and dry.     Findings: No erythema.     Comments: 4x2cm area of tenderness and fluctuance to the right axilla.  Some nodularities surrounding this area consistent with prior I&Ds.  No erythema or induration. No active drainage.  Neurological:     Mental Status: She is alert.  Psychiatric:        Behavior: Behavior normal.      ED Treatments / Results  Labs (all labs ordered are listed, but only abnormal results are displayed) Labs Reviewed - No data to display  EKG None  Radiology No results found.  Procedures .Marland Kitchen.Incision and Drainage Date/Time: 04/19/2019 8:28 PM Performed by: Jeanie SewerFawze,  Anays Detore A, PA-C Authorized by: Jeanie SewerFawze, Kalayla Shadden A, PA-C   Consent:    Consent obtained:  Verbal   Consent given by:  Patient   Risks discussed:  Bleeding, incomplete drainage, infection and pain   Alternatives discussed:  No treatment and delayed treatment Location:    Type:  Abscess   Size:  4x2cm   Location:  Upper extremity   Upper extremity location:  Arm   Arm location: right axilla. Pre-procedure details:    Skin preparation:  Betadine Anesthesia (see MAR for exact dosages):    Anesthesia method:  Topical application and local infiltration   Topical anesthetic:  LET   Local anesthetic:  Lidocaine 2% WITH epi Procedure type:    Complexity:  Complex Procedure details:    Needle aspiration: no     Incision types:  Stab incision   Incision depth:  Subcutaneous   Scalpel blade:  11   Wound management:  Probed and deloculated, irrigated with saline and extensive cleaning   Drainage:  Purulent   Drainage amount:  Copious   Packing materials:  1/4 in iodoform gauze Post-procedure details:    Patient tolerance of procedure:  Tolerated well, no immediate complications Ultrasound ED Soft Tissue Date/Time: 04/19/2019 8:29 PM Performed by: Jeanie SewerFawze, Diamone Whistler A, PA-C Authorized by: Michela PitcherFawze, Olin Gurski A, PA-C   Procedure details:    Indications: localization of abscess     Transverse view:  Visualized   Longitudinal view:  Visualized   Images: archived     Limitations:  Positioning and patient compliance Location:    Location: axilla     Side:  Right Findings:     abscess present   (including critical care time)  Medications Ordered in ED Medications  HYDROcodone-acetaminophen (NORCO/VICODIN) 5-325 MG per tablet 1 tablet (has no administration in time range)  lidocaine-EPINEPHrine-tetracaine (LET) solution (3 mLs Topical Given 04/19/19 1855)  lidocaine-EPINEPHrine (XYLOCAINE W/EPI) 2 %-1:200000 (PF) injection 10 mL (10 mLs Intradermal Given 04/19/19 1855)     Initial Impression / Assessment  and Plan / ED Course  I have reviewed the triage vital signs and the nursing notes.  Pertinent labs & imaging results that were available during my care of the patient were reviewed by me and considered in my medical decision making (see chart for details).  Patient presenting with right axilla abscess amenable to incision and drainage.  She also has a small abscess to the chest wall which is spontaneously draining with no signs of secondary skin infection surrounding this.  She is afebrile, vital signs are stable.  She is nontoxic in appearance.  Her pain was controlled while in the ED.  Abscess was packed with iodoform gauze.  She will return in 2 days for recheck if symptoms persist.  Encouraged home warm soaks and flushing.  Mild signs of cellulitis is surrounding skin.  No antibiotics indicated.  Discussed indications for return to the ED sooner.  Recommend follow-up with a PCP or dermatologist for reevaluation of her recurrent abscesses. Patient verbalized understanding of and agreement with plan and is safe for discharge home at this time.  Final Clinical Impressions(s) / ED Diagnoses   Final diagnoses:  Abscess of axilla, right    ED Discharge Orders    None       Bennye Alm 04/19/19 2033    Cathren Laine, MD 04/20/19 1525

## 2019-04-19 NOTE — ED Triage Notes (Signed)
Pt. Stated, Ive had a boil inderneath my rt. Arm since Tuesday and its deep. I had one in between my breast and it  Popped, and its still draining

## 2019-08-26 ENCOUNTER — Encounter (HOSPITAL_COMMUNITY): Payer: Self-pay | Admitting: Emergency Medicine

## 2019-08-26 ENCOUNTER — Other Ambulatory Visit: Payer: Self-pay

## 2019-08-26 ENCOUNTER — Emergency Department (HOSPITAL_COMMUNITY)
Admission: EM | Admit: 2019-08-26 | Discharge: 2019-08-27 | Disposition: A | Discharge status: Home / Self Care | Payer: Self-pay | Attending: Emergency Medicine | Admitting: Emergency Medicine

## 2019-08-26 DIAGNOSIS — L02411 Cutaneous abscess of right axilla: Secondary | ICD-10-CM | POA: Insufficient documentation

## 2019-08-26 MED ORDER — OXYCODONE-ACETAMINOPHEN 5-325 MG PO TABS
1.0000 | ORAL_TABLET | ORAL | Status: AC | PRN
  Administered 2019-08-26 – 2019-08-27 (×2): 1 via ORAL
  Filled 2019-08-26 (×2): qty 1

## 2019-08-26 NOTE — ED Triage Notes (Signed)
Has a history of an abscess under right arm. States been having increased pain for a week and pain currently 10/10 sharp with yellow drainage.

## 2019-08-27 MED ORDER — LIDOCAINE-EPINEPHRINE (PF) 2 %-1:200000 IJ SOLN
10.0000 mL | Freq: Once | INTRAMUSCULAR | Status: AC
  Administered 2019-08-27: 10 mL via INTRADERMAL
  Filled 2019-08-27: qty 20

## 2019-08-27 MED ORDER — DOXYCYCLINE HYCLATE 100 MG PO TABS
100.0000 mg | ORAL_TABLET | Freq: Once | ORAL | Status: AC
  Administered 2019-08-27: 03:00:00 100 mg via ORAL
  Filled 2019-08-27: qty 1

## 2019-08-27 MED ORDER — DOXYCYCLINE HYCLATE 100 MG PO CAPS: 100.0000 mg | ORAL_CAPSULE | Freq: Two times a day (BID) | ORAL | 0 refills | Status: AC

## 2019-08-27 NOTE — ED Provider Notes (Signed)
[place] EMERGENCY DEPARTMENT Provider Note   CSN: [id number] Arrival date & time: 08/26/19  1700     History   Chief Complaint Chief Complaint  Patient presents with  . Abscess    HPI Wendy Miles is a 28 y.o. female presents today for evaluation of acute onset, progressively worsening area of pain and swelling to the right axilla for 1 week.  Reports history of frequent abscesses to the axilla, typically on a monthly basis in association with her menstrual cycles and states she thinks that she has hidradenitis suppurativa.  Has been applying warm compresses, Epsom salts soaks and taking Tylenol with mild temporary relief.  Denies fevers, nausea, vomiting, chest pain, shortness of breath.  Reports the area was draining initially but then stopped and worsened.  She has not yet seen a dermatologist or general surgeon for her condition and is waiting for her insurance to take effect in November.     The history is provided by the patient.    Past Medical History:  Diagnosis Date  . Anxiety     There are no active problems to display for this patient.   Past Surgical History:  Procedure Laterality Date  . CESAREAN SECTION       OB History    Gravida  2   Para  1   Term  1   Preterm      AB  1   Living  1     SAB      TAB      Ectopic  1   Multiple      Live Births               Home Medications    Prior to Admission medications   Medication Sig Start Date End Date Taking? Authorizing Provider  doxycycline (VIBRAMYCIN) 100 MG capsule Take 1 capsule (100 mg total) by mouth 2 (two) times daily for 5 days. 08/27/19 09/01/19  Donne Robillard A, PA-C  metroNIDAZOLE (FLAGYL) 500 MG tablet Take 1 tablet (500 mg total) by mouth 2 (two) times daily. Patient not taking: Reported on 08/27/2019 05/22/18   Manya Silvas, CNM    Family History Family History  Problem Relation Age of Onset  . Diabetes Mother   . Heart disease Maternal  Grandfather     Social History Social History   Tobacco Use  . Smoking status: Current Every Day Smoker    Packs/day: 0.50  . Smokeless tobacco: Never Used  Substance Use Topics  . Alcohol use: Yes    Comment: rarely  . Drug use: Not Currently     Allergies   Bee venom   Review of Systems Review of Systems  Constitutional: Negative for chills and fever.  Respiratory: Negative for shortness of breath.   Cardiovascular: Negative for chest pain.  Gastrointestinal: Negative for abdominal pain, nausea and vomiting.  Skin: Positive for color change.       +abscess   All other systems reviewed and are negative.    Physical Exam Updated Vital Signs BP (!) 151/111   Pulse 90   Temp 99.2 F (37.3 C) (Oral)   Resp 18   Ht 5\' 4"  (1.626 m)   Wt 95.3 kg   SpO2 100%   BMI 36.05 kg/m   Physical Exam Vitals signs and nursing note reviewed.  Constitutional:      General: She is not in acute distress.    Appearance: She is well-developed.  Comments: Appears uncomfortable  HENT:     Head: Normocephalic and atraumatic.  Eyes:     General:        Right eye: No discharge.        Left eye: No discharge.     Conjunctiva/sclera: Conjunctivae normal.  Neck:     Vascular: No JVD.     Trachea: No tracheal deviation.  Cardiovascular:     Rate and Rhythm: Normal rate.  Pulmonary:     Effort: Pulmonary effort is normal.  Abdominal:     General: There is no distension.  Skin:    General: Skin is warm and dry.     Findings: No erythema.     Comments: 4 x 2 cm area of fluctuance and swelling to the right axilla with 5 cm of surrounding erythema circumferentially.  Significant tenderness to palpation.  No active drainage.  Neurological:     Mental Status: She is alert.  Psychiatric:        Behavior: Behavior normal.      ED Treatments / Results  Labs (all labs ordered are listed, but only abnormal results are displayed) Labs Reviewed - No data to display  EKG  None  Radiology No results found.  Procedures .Marland KitchenIncision and Drainage  Date/Time: 08/27/2019 3:00 AM Performed by: Jeanie Sewer, PA-C Authorized by: Jeanie Sewer, PA-C   Consent:    Consent obtained:  Verbal   Consent given by:  Patient   Risks discussed:  Bleeding, incomplete drainage, pain and damage to other organs   Alternatives discussed:  No treatment Universal protocol:    Procedure explained and questions answered to patient or proxy's satisfaction: yes     Relevant documents present and verified: yes     Test results available and properly labeled: yes     Imaging studies available: yes     Required blood products, implants, devices, and special equipment available: yes     Site/side marked: yes     Immediately prior to procedure a time out was called: yes     Patient identity confirmed:  Verbally with patient Location:    Type:  Abscess   Size:  4x2cm   Location:  Upper extremity   Upper extremity location: right axilla. Pre-procedure details:    Skin preparation:  Betadine Anesthesia (see MAR for exact dosages):    Anesthesia method:  Local infiltration   Local anesthetic:  Lidocaine 2% WITH epi Procedure type:    Complexity:  Complex Procedure details:    Incision types:  Single straight   Incision depth:  Subcutaneous   Scalpel blade:  11   Wound management:  Probed and deloculated, irrigated with saline and extensive cleaning   Drainage:  Purulent   Drainage amount:  Copious   Packing materials:  1/4 in iodoform gauze Post-procedure details:    Patient tolerance of procedure:  Tolerated well, no immediate complications   (including critical care time)  Medications Ordered in ED Medications  lidocaine-EPINEPHrine (XYLOCAINE W/EPI) 2 %-1:200000 (PF) injection 10 mL (has no administration in time range)  doxycycline (VIBRA-TABS) tablet 100 mg (has no administration in time range)  oxyCODONE-acetaminophen (PERCOCET/ROXICET) 5-325 MG per tablet 1  tablet (1 tablet Oral Given 08/27/19 0149)     Initial Impression / Assessment and Plan / ED Course  I have reviewed the triage vital signs and the nursing notes.  Pertinent labs & imaging results that were available during my care of the patient were reviewed by me and  considered in my medical decision making (see chart for details).        Patient with skin abscess amenable to incision and drainage.  Patient afebrile, vital signs stable. Nontoxic in appearance. No constitutional symptoms and she does not appear septic at this time. Has a history of the same. Abscess packed with iodoform gauze, instructions to return in 48 hours for packing removal.  Given signs of surrounding cellulitis will discharge with course of antibiotics.  Encouraged home warm soaks and flushing.  Recommend follow-up with general surgery or dermatology for reevaluation of her symptoms due to likely hidradenitis suppurativa. Discussed strict ED return precautions. Pt verbalized understanding of and agreement with plan and is safe for discharge home at this time.   Final Clinical Impressions(s) / ED Diagnoses   Final diagnoses:  Abscess of axilla, right    ED Discharge Orders         Ordered    doxycycline (VIBRAMYCIN) 100 MG capsule  2 times daily     08/27/19 0259           Jeanie SewerFawze, Kelsen Celona A, PA-C 08/27/19 0304    Dione BoozeGlick, David, MD 08/27/19 [phone]

## 2019-08-27 NOTE — Discharge Instructions (Signed)
Please take all of your antibiotics until finished!   Take your antibiotics with food.  Common side effects of antibiotics include nausea, vomiting, abdominal discomfort, and diarrhea. You may help offset some of this with probiotics which you can buy or get in yogurt. Do not eat  or take the probiotics until 2 hours after your antibiotic.    Some studies suggest that certain antibiotics can reduce the efficacy of certain oral contraceptive pills (birth control), so please use additional contraceptives (condoms or other barrier method) while you are taking the antibiotics and for an additional 5 to 7 days afterwards if you are a female on these medications. Keep wound clean and dry. Apply warm compresses throughout the day. You can take 1 to 2 tablets of Tylenol (350mg -1000mg  depending on the dose) every 6 hours as needed for pain.  Do not exceed 4000 mg of Tylenol daily.  If your pain persists you can take a doses of ibuprofen in between doses of Tylenol.  I usually recommend 400 to 600 mg of ibuprofen every 6 hours.  Take this with food to avoid upset stomach issues.   Followup with [facility] in 2 days for wound recheck and packing removal.  Return to emergency department for emergent changing or worsening symptoms.

## 2019-08-27 NOTE — ED Notes (Signed)
Family at bedside. 

## 2019-11-12 ENCOUNTER — Ambulatory Visit: Payer: Self-pay | Admitting: *Deleted

## 2019-11-12 NOTE — Telephone Encounter (Signed)
Pt called with having abd cramping that she thinks started about 2 weeks sporadically. Lower abd cramps feeling like menstrual cramps.  She sometimes has pain in the lower mid part of her back. She also urinates more frequently. Urine is clear without foul smell. Her LMP Dec 1 st.  She has not taken any medication for the pain.  She drinks a lot of water. Advised to take pain med and try a warm compress to her abd. She is advised to make an appointment with a provider. She does not have insurance at this time and will have to wait until Jan. f the pain gets worst and she has a fever she would need to be seen in the ED. She voiced understanding.

## 2019-11-12 NOTE — Telephone Encounter (Signed)
  Reason for Disposition . Nursing judgment  Protocols used: NO GUIDELINE OR REFERENCE AVAILABLE-A-AH  

## 2019-12-29 ENCOUNTER — Other Ambulatory Visit: Payer: Self-pay

## 2019-12-29 ENCOUNTER — Emergency Department (HOSPITAL_BASED_OUTPATIENT_CLINIC_OR_DEPARTMENT_OTHER)
Admission: EM | Admit: 2019-12-29 | Discharge: 2019-12-29 | Disposition: A | Discharge status: Home / Self Care | Payer: Managed Care, Other (non HMO) | Attending: Emergency Medicine | Admitting: Emergency Medicine

## 2019-12-29 ENCOUNTER — Encounter (HOSPITAL_BASED_OUTPATIENT_CLINIC_OR_DEPARTMENT_OTHER): Payer: Self-pay | Admitting: Emergency Medicine

## 2019-12-29 DIAGNOSIS — F17210 Nicotine dependence, cigarettes, uncomplicated: Secondary | ICD-10-CM | POA: Diagnosis not present

## 2019-12-29 DIAGNOSIS — Z91030 Bee allergy status: Secondary | ICD-10-CM | POA: Diagnosis not present

## 2019-12-29 DIAGNOSIS — J039 Acute tonsillitis, unspecified: Secondary | ICD-10-CM | POA: Diagnosis not present

## 2019-12-29 DIAGNOSIS — J029 Acute pharyngitis, unspecified: Secondary | ICD-10-CM | POA: Diagnosis present

## 2019-12-29 LAB — GROUP A STREP BY PCR: Group A Strep by PCR: NOT DETECTED

## 2019-12-29 MED ORDER — LIDOCAINE VISCOUS HCL 2 % MT SOLN: 15.0000 mL | OROMUCOSAL | 0 refills | Status: DC | PRN

## 2019-12-29 MED ORDER — DEXAMETHASONE SODIUM PHOSPHATE 10 MG/ML IJ SOLN
10.0000 mg | Freq: Once | INTRAMUSCULAR | Status: AC
  Administered 2019-12-29: 12:00:00 10 mg via INTRAVENOUS
  Filled 2019-12-29: qty 1

## 2019-12-29 MED ORDER — KETOROLAC TROMETHAMINE 30 MG/ML IJ SOLN
30.0000 mg | Freq: Once | INTRAMUSCULAR | Status: AC
  Administered 2019-12-29: 12:00:00 30 mg via INTRAVENOUS

## 2019-12-29 MED ORDER — PENICILLIN G BENZATHINE 1200000 UNIT/2ML IM SUSP
1.2000 10*6.[IU] | Freq: Once | INTRAMUSCULAR | Status: AC
  Administered 2019-12-29: 13:00:00 1.2 10*6.[IU] via INTRAMUSCULAR
  Filled 2019-12-29: qty 2

## 2019-12-29 MED ORDER — SODIUM CHLORIDE 0.9 % IV BOLUS
1000.0000 mL | Freq: Once | INTRAVENOUS | Status: AC
  Administered 2019-12-29: 12:00:00 1000 mL via INTRAVENOUS

## 2019-12-29 MED ORDER — IBUPROFEN 600 MG PO TABS: 600.0000 mg | ORAL_TABLET | Freq: Four times a day (QID) | ORAL | 0 refills | Status: DC | PRN

## 2019-12-29 MED ORDER — HYDROCODONE-ACETAMINOPHEN 5-325 MG PO TABS: 1.0000 | ORAL_TABLET | ORAL | 0 refills | Status: DC | PRN

## 2019-12-29 MED ORDER — LIDOCAINE VISCOUS HCL 2 % MT SOLN
15.0000 mL | Freq: Once | OROMUCOSAL | Status: AC
  Administered 2019-12-29: 12:00:00 15 mL via OROMUCOSAL
  Filled 2019-12-29: qty 15

## 2019-12-29 MED ORDER — KETOROLAC TROMETHAMINE 30 MG/ML IJ SOLN
30.0000 mg | Freq: Once | INTRAMUSCULAR | Status: DC
  Filled 2019-12-29: qty 1

## 2019-12-29 NOTE — ED Provider Notes (Signed)
[facility] EMERGENCY DEPARTMENT Provider Note   CSN: [id number] Arrival date & time: 12/29/19  1120     History Chief Complaint  Patient presents with  . Sore Throat    Wendy Miles is a 29 y.o. female.  Pt presents to the ED today with a sore throat for the past 2 days.  She said it hurts to talk and to swallow.  No known covid exposures.        Past Medical History:  Diagnosis Date  . Anxiety     There are no problems to display for this patient.   Past Surgical History:  Procedure Laterality Date  . CESAREAN SECTION       OB History    Gravida  2   Para  1   Term  1   Preterm      AB  1   Living  1     SAB      TAB      Ectopic  1   Multiple      Live Births              Family History  Problem Relation Age of Onset  . Diabetes Mother   . Heart disease Maternal Grandfather     Social History   Tobacco Use  . Smoking status: Current Every Day Smoker    Packs/day: 0.50  . Smokeless tobacco: Never Used  Substance Use Topics  . Alcohol use: Yes    Comment: rarely  . Drug use: Not Currently    Home Medications Prior to Admission medications   Medication Sig Start Date End Date Taking? Authorizing Provider  HYDROcodone-acetaminophen (NORCO/VICODIN) 5-325 MG tablet Take 1 tablet by mouth every 4 (four) hours as needed. 12/29/19   Jacalyn Lefevre, MD  ibuprofen (ADVIL) 600 MG tablet Take 1 tablet (600 mg total) by mouth every 6 (six) hours as needed. 12/29/19   Jacalyn Lefevre, MD  lidocaine (XYLOCAINE) 2 % solution Use as directed 15 mLs in the mouth or throat as needed for mouth pain. 12/29/19   Jacalyn Lefevre, MD  metroNIDAZOLE (FLAGYL) 500 MG tablet Take 1 tablet (500 mg total) by mouth 2 (two) times daily. Patient not taking: Reported on 08/27/2019 05/22/18   Katrinka Blazing [place], CNM    Allergies    Bee venom  Review of Systems   Review of Systems  HENT: Positive for sore throat.   All other systems reviewed and are  negative.   Physical Exam Updated Vital Signs BP (!) 139/98 (BP Location: Right Arm)   Pulse (!) 102   Temp 99 F (37.2 C) (Oral)   Resp 18   Ht 5\' 4"  (1.626 m)   Wt 95.3 kg   LMP 12/15/2019   SpO2 100%   BMI 36.05 kg/m   Physical Exam Vitals and nursing note reviewed.  Constitutional:      Appearance: She is well-developed.  HENT:     Head: Normocephalic and atraumatic.     Jaw: There is normal jaw occlusion.     Right Ear: Ear canal normal.     Left Ear: Ear canal normal.     Mouth/Throat:     Mouth: Mucous membranes are moist.     Pharynx: Pharyngeal swelling and posterior oropharyngeal erythema present.     Tonsils: Tonsillar exudate present. No tonsillar abscesses.  Eyes:     Conjunctiva/sclera: Conjunctivae normal.  Cardiovascular:     Rate and Rhythm: Normal rate and regular  rhythm.  Pulmonary:     Effort: Pulmonary effort is normal.     Breath sounds: Normal breath sounds.  Abdominal:     Palpations: Abdomen is soft.  Musculoskeletal:     Cervical back: Normal range of motion and neck supple.  Lymphadenopathy:     Cervical: Cervical adenopathy present.  Skin:    General: Skin is warm.     Capillary Refill: Capillary refill takes less than 2 seconds.  Neurological:     General: No focal deficit present.     Mental Status: She is alert and oriented to person, place, and time.  Psychiatric:        Mood and Affect: Mood normal.        Behavior: Behavior normal.     ED Results / Procedures / Treatments   Labs (all labs ordered are listed, but only abnormal results are displayed) Labs Reviewed  GROUP A STREP BY PCR    EKG None  Radiology No results found.  Procedures Procedures (including critical care time)  Medications Ordered in ED Medications  dexamethasone (DECADRON) injection 10 mg (10 mg Intravenous Given 12/29/19 1200)  lidocaine (XYLOCAINE) 2 % viscous mouth solution 15 mL (15 mLs Mouth/Throat Given 12/29/19 1152)  sodium chloride 0.9  % bolus 1,000 mL (0 mLs Intravenous Stopped 12/29/19 1300)  ketorolac (TORADOL) 30 MG/ML injection 30 mg (30 mg Intravenous Given 12/29/19 1200)  penicillin g benzathine (BICILLIN LA) 1200000 UNIT/2ML injection 1.2 Million Units (1.2 Million Units Intramuscular Given 12/29/19 1301)    ED Course  I have reviewed the triage vital signs and the nursing notes.  Pertinent labs & imaging results that were available during my care of the patient were reviewed by me and considered in my medical decision making (see chart for details).    MDM Rules/Calculators/A&P                      Pt given IVFs, decadron, and toradol and is feeling a little better.   Pt's strep came back negative, but due to bilateral tonsillar inflammation and exudates, I treated her with bicillin LA.  Pt knows to return if worse.     Final Clinical Impression(s) / ED Diagnoses Final diagnoses:  Tonsillitis    Rx / DC Orders ED Discharge Orders         Ordered    HYDROcodone-acetaminophen (NORCO/VICODIN) 5-325 MG tablet  Every 4 hours PRN     12/29/19 1303    ibuprofen (ADVIL) 600 MG tablet  Every 6 hours PRN     12/29/19 1303    lidocaine (XYLOCAINE) 2 % solution  As needed     12/29/19 1303           Isla Pence, MD 12/29/19 1305

## 2019-12-29 NOTE — ED Notes (Signed)
Pt c/o throat and ear pain x 1 week. Pt reports difficulty swallowing. Pt denies fever, also reports HA

## 2019-12-29 NOTE — ED Triage Notes (Signed)
Sore throat x 2 days

## 2020-02-29 ENCOUNTER — Other Ambulatory Visit: Payer: Self-pay

## 2020-02-29 ENCOUNTER — Inpatient Hospital Stay (HOSPITAL_COMMUNITY)
Admission: AD | Admit: 2020-02-29 | Discharge: 2020-02-29 | Disposition: A | Discharge status: Home / Self Care | Payer: Managed Care, Other (non HMO) | Attending: Obstetrics and Gynecology | Admitting: Obstetrics and Gynecology

## 2020-02-29 DIAGNOSIS — N939 Abnormal uterine and vaginal bleeding, unspecified: Secondary | ICD-10-CM | POA: Diagnosis present

## 2020-02-29 DIAGNOSIS — Z3202 Encounter for pregnancy test, result negative: Secondary | ICD-10-CM | POA: Diagnosis not present

## 2020-02-29 LAB — HCG, QUANTITATIVE, PREGNANCY: hCG, Beta Chain, Quant, S: <1 m[IU]/mL (ref <5)

## 2020-02-29 LAB — POCT PREGNANCY, URINE: Preg Test, Ur: NEGATIVE

## 2020-02-29 NOTE — Discharge Instructions (Signed)
  [facility] [employer] [facility] [employer] at [facility]       Phone: [phone]  Center for [employer] at [place] Phone: [phone]  Center for [employer] at [place]  Phone: [phone]  Center for [employer] [facility] at [employer]  Phone: [phone]  Center for [facility] at [place]  Phone: [phone]  [facility]       Phone: [phone]  [facility]    Phone: [phone]   [facility] [place])    Phone: [phone]  [facility]    Phone: [phone]  [facility]                                     Phone: [phone]  [facility]    Phone: [phone]  [facility]    Phone: [phone]  [facility]       Phone: [phone]   [facility]  Phone: [phone]  [facility]    Phone: [phone]  [facility]   Phone: [phone]  [facility]      Phone: [phone]  [facility]    Phone: [phone]

## 2020-02-29 NOTE — MAU Note (Signed)
Wendy Miles is a 29 y.o. here in MAU reporting: spotting last week and then this morning had some bright red spotting. Had faint + UPT at home.   LMP: 02/12/20  Onset of complaint: today  Pain score: 0/10  Vitals:   02/29/20 1756  BP: (!) 129/92  Pulse: 94  Resp: 16  Temp: 98 F (36.7 C)  SpO2: 100%     Lab orders placed from triage: UPT

## 2020-02-29 NOTE — MAU Note (Signed)
Pt giving urine sample at this time.

## 2020-02-29 NOTE — MAU Provider Note (Signed)
First Provider Initiated Contact with Patient 02/29/20 1801      S Ms. Wendy Miles is a 29 y.o. [id number] non-pregnant female who presents to MAU today with complaint of vaginal bleeding. States she thinks she had a faint positive pregnancy test last week. LMP 3/24. Started having vaginal spotting last week and again this morning. Denies abdominal pain.    O BP (!) 129/92 (BP Location: Right Arm)   Pulse 94   Temp 98 F (36.7 C) (Oral)   Resp 16   LMP 02/12/2020   SpO2 100% Comment: room air Physical Exam  Nursing note and vitals reviewed. Constitutional: She appears well-developed and well-nourished. No distress.  Respiratory: Effort normal. No respiratory distress.  Skin: She is not diaphoretic.  Psychiatric: She has a normal mood and affect. Her behavior is normal. Judgment and thought content normal.   HCG negative  A Non pregnant female Medical screening exam complete  P Discharge from MAU in stable condition Patient given the option of transfer to [facility] for further evaluation or seek care in outpatient facility of choice List of options for follow-up given  Warning signs for worsening condition that would warrant emergency follow-up discussed Patient may return to MAU as needed for pregnancy related complaints  Judeth Horn, NP 02/29/2020 8:40 PM

## 2020-03-02 LAB — ABO/RH: ABO/RH(D): O POS

## 2020-06-28 ENCOUNTER — Emergency Department (HOSPITAL_BASED_OUTPATIENT_CLINIC_OR_DEPARTMENT_OTHER): Payer: Managed Care, Other (non HMO)

## 2020-06-28 ENCOUNTER — Emergency Department (HOSPITAL_BASED_OUTPATIENT_CLINIC_OR_DEPARTMENT_OTHER)
Admission: EM | Admit: 2020-06-28 | Discharge: 2020-06-28 | Disposition: A | Discharge status: Home / Self Care | Payer: Managed Care, Other (non HMO) | Attending: Emergency Medicine | Admitting: Emergency Medicine

## 2020-06-28 ENCOUNTER — Other Ambulatory Visit: Payer: Self-pay

## 2020-06-28 ENCOUNTER — Encounter (HOSPITAL_BASED_OUTPATIENT_CLINIC_OR_DEPARTMENT_OTHER): Payer: Self-pay | Admitting: Emergency Medicine

## 2020-06-28 DIAGNOSIS — Y939 Activity, unspecified: Secondary | ICD-10-CM | POA: Diagnosis not present

## 2020-06-28 DIAGNOSIS — X58XXXA Exposure to other specified factors, initial encounter: Secondary | ICD-10-CM | POA: Insufficient documentation

## 2020-06-28 DIAGNOSIS — Y999 Unspecified external cause status: Secondary | ICD-10-CM | POA: Insufficient documentation

## 2020-06-28 DIAGNOSIS — S96912A Strain of unspecified muscle and tendon at ankle and foot level, left foot, initial encounter: Secondary | ICD-10-CM | POA: Diagnosis not present

## 2020-06-28 DIAGNOSIS — S80811A Abrasion, right lower leg, initial encounter: Secondary | ICD-10-CM

## 2020-06-28 DIAGNOSIS — F17200 Nicotine dependence, unspecified, uncomplicated: Secondary | ICD-10-CM | POA: Insufficient documentation

## 2020-06-28 DIAGNOSIS — S99922A Unspecified injury of left foot, initial encounter: Secondary | ICD-10-CM | POA: Diagnosis present

## 2020-06-28 DIAGNOSIS — Y929 Unspecified place or not applicable: Secondary | ICD-10-CM | POA: Insufficient documentation

## 2020-06-28 MED ORDER — OXYCODONE-ACETAMINOPHEN 5-325 MG PO TABS
1.0000 | ORAL_TABLET | Freq: Once | ORAL | Status: AC
  Administered 2020-06-28: 1 via ORAL
  Filled 2020-06-28: qty 1

## 2020-06-28 NOTE — ED Triage Notes (Signed)
Pt c/o left foot pain and has scratches to her right shin area.  Pt had a bottle of Vodka last night because her Grandmother recently passed away.  Pt denies suicidal ideation.  Pt does not remember anything. Pt reports that she was by herself at the time.

## 2020-06-28 NOTE — Discharge Instructions (Addendum)
Please schedule follow-up appointment with the sports medicine specialist.  Recommend bearing weight as tolerated.  Recommend Tylenol Motrin for pain control.  Recommend ice, elevation and rest for today.  If you have any thoughts of hurting herself, hurting others, please return to ER for reassessment.

## 2020-06-28 NOTE — ED Notes (Signed)
ED Provider at bedside. 

## 2020-06-29 ENCOUNTER — Ambulatory Visit: Payer: Self-pay

## 2020-06-29 ENCOUNTER — Ambulatory Visit (INDEPENDENT_AMBULATORY_CARE_PROVIDER_SITE_OTHER): Payer: Managed Care, Other (non HMO) | Admitting: Family Medicine

## 2020-06-29 ENCOUNTER — Encounter: Payer: Self-pay | Admitting: Family Medicine

## 2020-06-29 VITALS — BP 130/87 | HR 88 | Ht 64.0 in | Wt 220.0 lb

## 2020-06-29 DIAGNOSIS — S93602A Unspecified sprain of left foot, initial encounter: Secondary | ICD-10-CM | POA: Diagnosis not present

## 2020-06-29 DIAGNOSIS — M79672 Pain in left foot: Secondary | ICD-10-CM

## 2020-06-29 MED ORDER — AMBULATORY NON FORMULARY MEDICATION: 0 refills | Status: AC

## 2020-06-29 NOTE — Assessment & Plan Note (Signed)
Initial injury occurred on 8/8.  Unsure of the mechanism.  No specific fracture observed but increased hyperemia throughout the midfoot.  Seems less likely to involve the Lisfranc ligament.  Could be a sprain but multiple ligaments. -Counseled supportive care. -Counseled on nonweightbearing and provided rolling knee scooter. -Follow-up in 2 weeks.  Is still significant we will consider MRI to evaluate for occult fracture versus evaluation of Lisfranc ligament.

## 2020-06-29 NOTE — Patient Instructions (Signed)
Nice to meet you Please try ice  Please keep the weight off of that foot   Please send me a message in MyChart with any questions or updates.  Please see me back in 2 weeks.   --Dr. Trilby Leaver

## 2020-06-29 NOTE — Progress Notes (Signed)
Wendy Miles - 29 y.o. female MRN [id number]  Date of birth: Oct 28, 1991  SUBJECTIVE:  Including CC & ROS.  Chief Complaint  Patient presents with  . Foot Injury    left x 06/28/2020    Wendy Miles is a 29 y.o. female that is she is presenting with left midfoot pain.  She had an injury on Saturday night.  She is unsure of the exact mechanism.  Has significant pain with standing on the left foot.  No history of surgery.  Has been trying elevation and ice..  Independent review of the left foot from 8/8 shows no acute abnormality.   Review of Systems See HPI   HISTORY: Past Medical, Surgical, Social, and Family History Reviewed & Updated per EMR.   Pertinent Historical Findings include:  Past Medical History:  Diagnosis Date  . Anxiety     Past Surgical History:  Procedure Laterality Date  . CESAREAN SECTION      Family History  Problem Relation Age of Onset  . Diabetes Mother   . Heart disease Maternal Grandfather     Social History   Socioeconomic History  . Marital status: Single    Spouse name: Not on file  . Number of children: Not on file  . Years of education: Not on file  . Highest education level: Not on file  Occupational History  . Not on file  Tobacco Use  . Smoking status: Current Every Day Smoker    Packs/day: 0.50  . Smokeless tobacco: Never Used  Vaping Use  . Vaping Use: Never used  Substance and Sexual Activity  . Alcohol use: Yes    Comment: rarely  . Drug use: Not Currently  . Sexual activity: Yes    Birth control/protection: Condom    Comment: sometimes  Other Topics Concern  . Not on file  Social History Narrative  . Not on file   Social Determinants of Health   Financial Resource Strain:   . Difficulty of Paying Living Expenses:   Food Insecurity:   . Worried About Programme researcher, broadcasting/film/video in the Last Year:   . Barista in the Last Year:   Transportation Needs:   . Freight forwarder (Medical):   Marland Kitchen Lack of  Transportation (Non-Medical):   Physical Activity:   . Days of Exercise per Week:   . Minutes of Exercise per Session:   Stress:   . Feeling of Stress :   Social Connections:   . Frequency of Communication with Friends and Family:   . Frequency of Social Gatherings with Friends and Family:   . Attends Religious Services:   . Active Member of Clubs or Organizations:   . Attends Banker Meetings:   Marland Kitchen Marital Status:   Intimate Partner Violence:   . Fear of Current or Ex-Partner:   . Emotionally Abused:   Marland Kitchen Physically Abused:   . Sexually Abused:      PHYSICAL EXAM:  VS: BP 130/87   Pulse 88   Ht 5\' 4"  (1.626 m)   Wt 220 lb (99.8 kg)   LMP 06/09/2020   BMI 37.76 kg/m  Physical Exam Gen: NAD, alert, cooperative with exam, well-appearing MSK:  Left foot: Swelling of the dorsum of the midfoot. Limited flexion extension of the third, fourth and fifth digit. Significant pain with weightbearing. Tenderness to palpation through the midfoot of the metatarsal shafts of the third, fourth and fifth. Tenderness to palpation of the tarsometatarsal joints.  Neurovascularly intact  Limited ultrasound: Left foot:  No changes observed over the first MTP joint. No changes observed of the Lisfranc ligament. Significant hyperemia observed at the base of the third and the fourth and the tarsometatarsal joints. No changes at the base of the fifth.   Summary: Significant hyperemia through the midfoot to suggest a sprain.  Ultrasound and interpretation by Clare Gandy, MD    ASSESSMENT & PLAN:   Sprain of left foot Initial injury occurred on 8/8.  Unsure of the mechanism.  No specific fracture observed but increased hyperemia throughout the midfoot.  Seems less likely to involve the Lisfranc ligament.  Could be a sprain but multiple ligaments. -Counseled supportive care. -Counseled on nonweightbearing and provided rolling knee scooter. -Follow-up in 2 weeks.  Is still  significant we will consider MRI to evaluate for occult fracture versus evaluation of Lisfranc ligament.

## 2020-06-29 NOTE — ED Provider Notes (Signed)
[facility] EMERGENCY DEPARTMENT Provider Note   CSN: [id number] Arrival date & time: 06/28/20  1544     History Chief Complaint  Patient presents with  . Foot Injury    Wendy Miles is a 29 y.o. female.  Presents here with concern for left foot pain, abrasion to right lower leg.  She reports that her grandmother recently passed away and she has been feeling very down.  Denies any thoughts of hurting herself or hurting others, denies suicidal ideation.  Feels that she has a safe place at home, good support from family.  Was drinking heavily last night, thinks she passed out and does not recall all the events.  This morning she noted some pain in her left foot as well as some abrasions on her right shin.  Has had difficulty walking due to the pain in her left foot.  Denies any head trauma, no other symptoms.  HPI     Past Medical History:  Diagnosis Date  . Anxiety     Patient Active Problem List   Diagnosis Date Noted  . Sprain of left foot 06/29/2020    Past Surgical History:  Procedure Laterality Date  . CESAREAN SECTION       OB History    Gravida  2   Para  1   Term  1   Preterm      AB  1   Living  1     SAB      TAB      Ectopic  1   Multiple      Live Births              Family History  Problem Relation Age of Onset  . Diabetes Mother   . Heart disease Maternal Grandfather     Social History   Tobacco Use  . Smoking status: Current Every Day Smoker    Packs/day: 0.50  . Smokeless tobacco: Never Used  Vaping Use  . Vaping Use: Never used  Substance Use Topics  . Alcohol use: Yes    Comment: rarely  . Drug use: Not Currently    Home Medications Prior to Admission medications   Medication Sig Start Date End Date Taking? Authorizing Provider  AMBULATORY NON FORMULARY MEDICATION Rolling Knee Scooter, please take Rx to medical supply store. 06/29/20   Myra Rude, MD    Allergies    Bee venom  Review of  Systems   Review of Systems  Constitutional: Negative for chills and fever.  HENT: Negative for ear pain and sore throat.   Eyes: Negative for pain and visual disturbance.  Respiratory: Negative for cough and shortness of breath.   Cardiovascular: Negative for chest pain and palpitations.  Gastrointestinal: Negative for abdominal pain and vomiting.  Genitourinary: Negative for dysuria and hematuria.  Musculoskeletal: Positive for arthralgias. Negative for back pain.  Skin: Negative for color change and rash.  Neurological: Negative for seizures and syncope.  All other systems reviewed and are negative.   Physical Exam Updated Vital Signs BP 110/71 (BP Location: Right Arm)   Pulse 93   Temp 98.6 F (37 C) (Oral)   Resp 18   Ht 5\' 4"  (1.626 m)   Wt 99.8 kg   LMP 06/09/2020   SpO2 98%   BMI 37.76 kg/m   Physical Exam Vitals and nursing note reviewed.  Constitutional:      General: She is not in acute distress.    Appearance: She  is well-developed.  HENT:     Head: Normocephalic and atraumatic.  Eyes:     Conjunctiva/sclera: Conjunctivae normal.  Cardiovascular:     Rate and Rhythm: Normal rate and regular rhythm.     Heart sounds: No murmur heard.   Pulmonary:     Effort: Pulmonary effort is normal. No respiratory distress.     Breath sounds: Normal breath sounds.  Abdominal:     Palpations: Abdomen is soft.     Tenderness: There is no abdominal tenderness.  Musculoskeletal:     Cervical back: Neck supple.     Comments: Back: no C, T, L spine TTP, no step off or deformity RUE: no TTP throughout, no deformity, normal joint ROM, radial pulse intact, distal sensation and motor intact LUE: no TTP throughout, no deformity, normal joint ROM, radial pulse intact, distal sensation and motor intact RLE:  Superficial abrasion to right lower leg, no TTP throughout, no other deformity, normal joint ROM, distal pulse, sensation and motor intact LLE: TTP over mid foot, forefoot,  otherwise no TTP throughout, no deformity, normal joint ROM, distal pulse, sensation and motor intact  Skin:    General: Skin is warm and dry.  Neurological:     Mental Status: She is alert.     ED Results / Procedures / Treatments   Labs (all labs ordered are listed, but only abnormal results are displayed) Labs Reviewed - No data to display  EKG None  Radiology DG Tibia/Fibula Right  Result Date: 06/28/2020 CLINICAL DATA:  Anterolateral lower leg abrasion, pain EXAM: RIGHT TIBIA AND FIBULA - 2 VIEW COMPARISON:  None. FINDINGS: There is no evidence of fracture or other focal bone lesions. Soft tissues are unremarkable. IMPRESSION: Negative. Electronically Signed   By: Duanne Guess D.O.   On: 06/28/2020 17:34   DG Foot Complete Left  Result Date: 06/28/2020 CLINICAL DATA:  Midfoot pain after fall EXAM: LEFT FOOT - COMPLETE 3+ VIEW COMPARISON:  None. FINDINGS: There is no evidence of fracture or dislocation. There is no evidence of arthropathy or other focal bone abnormality. Soft tissues are unremarkable. IMPRESSION: Negative. Electronically Signed   By: Duanne Guess D.O.   On: 06/28/2020 17:33    Procedures Procedures (including critical care time)  Medications Ordered in ED Medications  oxyCODONE-acetaminophen (PERCOCET/ROXICET) 5-325 MG per tablet 1 tablet (1 tablet Oral Given 06/28/20 1722)    ED Course  I have reviewed the triage vital signs and the nursing notes.  Pertinent labs & imaging results that were available during my care of the patient were reviewed by me and considered in my medical decision making (see chart for details).    MDM Rules/Calculators/A&P                          29 year old lady presents to ER with concern for left foot pain, right leg abrasion after suspected fall last night while drunk.  Plain films of affected areas were negative.  Provided crutches for some support and recommended weightbearing as tolerated.  Recommended follow-up with  sports medicine.  Regarding the heavy drinking last night, recent death in family, patient reports no SI, has a safe place at home, accompanied by mother.    After the discussed management above, the patient was determined to be safe for discharge.  The patient was in agreement with this plan and all questions regarding their care were answered.  ED return precautions were discussed and the patient will return  to the ED with any significant worsening of condition.   Final Clinical Impression(s) / ED Diagnoses Final diagnoses:  Abrasion of right lower extremity, initial encounter  Muscle strain of left foot, initial encounter    Rx / DC Orders ED Discharge Orders    None       Milagros Loll, MD 06/29/20 1231

## 2020-07-01 ENCOUNTER — Telehealth: Payer: Self-pay | Admitting: Family Medicine

## 2020-07-01 NOTE — Telephone Encounter (Signed)
Patient called states she submitted our Wk note to employer/ [employer] but they state it needs to be more specific w/ what work restriction she has, such as :   -- How long she can stand /Work on her feet. --- If there are work hour restrictions, pls clarify. --If there are lifting/ carrying wt restrictions ( lbs).  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  --glh

## 2020-07-01 NOTE — Telephone Encounter (Signed)
Provided work note.   Myra Rude, MD [facility] 07/01/2020, 4:02 PM

## 2020-07-14 ENCOUNTER — Ambulatory Visit: Payer: Managed Care, Other (non HMO) | Admitting: Family Medicine

## 2020-12-29 DIAGNOSIS — R87618 Other abnormal cytological findings on specimens from cervix uteri: Secondary | ICD-10-CM | POA: Insufficient documentation

## 2021-01-05 ENCOUNTER — Emergency Department (HOSPITAL_BASED_OUTPATIENT_CLINIC_OR_DEPARTMENT_OTHER)
Admission: EM | Admit: 2021-01-05 | Discharge: 2021-01-05 | Disposition: A | Discharge status: Home / Self Care | Payer: 59 | Attending: Emergency Medicine | Admitting: Emergency Medicine

## 2021-01-05 ENCOUNTER — Encounter (HOSPITAL_BASED_OUTPATIENT_CLINIC_OR_DEPARTMENT_OTHER): Payer: Self-pay

## 2021-01-05 ENCOUNTER — Other Ambulatory Visit: Payer: Self-pay

## 2021-01-05 DIAGNOSIS — U071 COVID-19: Secondary | ICD-10-CM | POA: Insufficient documentation

## 2021-01-05 DIAGNOSIS — B349 Viral infection, unspecified: Secondary | ICD-10-CM

## 2021-01-05 DIAGNOSIS — M545 Low back pain, unspecified: Secondary | ICD-10-CM | POA: Insufficient documentation

## 2021-01-05 DIAGNOSIS — F17200 Nicotine dependence, unspecified, uncomplicated: Secondary | ICD-10-CM | POA: Diagnosis not present

## 2021-01-05 DIAGNOSIS — R0981 Nasal congestion: Secondary | ICD-10-CM | POA: Diagnosis present

## 2021-01-05 LAB — URINALYSIS, ROUTINE W REFLEX MICROSCOPIC
Bilirubin Urine: NEGATIVE
Glucose, UA: NEGATIVE mg/dL
Ketones, ur: 40 mg/dL — AB
Nitrite: NEGATIVE
Protein, ur: >300 mg/dL — AB
Specific Gravity, Urine: 1.03 (ref 1.005–1.030)
pH: 6 (ref 5.0–8.0)

## 2021-01-05 LAB — URINALYSIS, MICROSCOPIC (REFLEX)

## 2021-01-05 LAB — PREGNANCY, URINE: Preg Test, Ur: NEGATIVE

## 2021-01-05 MED ORDER — ONDANSETRON HCL 4 MG PO TABS: 4.0000 mg | ORAL_TABLET | Freq: Three times a day (TID) | ORAL | 0 refills | Status: AC | PRN

## 2021-01-05 NOTE — ED Provider Notes (Signed)
[facility] EMERGENCY DEPARTMENT Provider Note   CSN: [id number] Arrival date & time: 01/05/21  1023     History Chief Complaint  Patient presents with  . Generalized Body Aches    Wendy Miles is a 30 y.o. female.  HPI   Patient with no significant medical history presents to the emergency department with chief complaint of URI-like symptoms.  Patient endorses symptoms started yesterday morning around 3 AM, she endorses chills, headaches, nasal congestion, productive cough, general body aches mainly in her back and diarrhea.  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  She denies chest pain, shortness of breath, she does endorse that her lower back pain, radiates into her abdomen, she denies urinary symptoms, vaginal discharge vaginal bleeding.  She has no significant abdominal history, no history of kidney stones, gallstones, pancreatitis, small bowel obstructions.  She is tolerating p.o. without difficulty, she denies alleviating factors.   Past Medical History:  Diagnosis Date  . Anxiety     Patient Active Problem List   Diagnosis Date Noted  . Sprain of left foot 06/29/2020    Past Surgical History:  Procedure Laterality Date  . CESAREAN SECTION       OB History    Gravida  2   Para  1   Term  1   Preterm      AB  1   Living  1     SAB      IAB      Ectopic  1   Multiple      Live Births              Family History  Problem Relation Age of Onset  . Diabetes Mother   . Heart disease Maternal Grandfather     Social History   Tobacco Use  . Smoking status: Current Every Day Smoker    Packs/day: 0.50  . Smokeless tobacco: Never Used  Vaping Use  . Vaping Use: Never used  Substance Use Topics  . Alcohol use: Yes    Comment: rarely  . Drug use: Yes    Types: Marijuana    Home Medications Prior to  Admission medications   Medication Sig Start Date End Date Taking? Authorizing Provider  ondansetron (ZOFRAN) 4 MG tablet Take 1 tablet (4 mg total) by mouth every 8 (eight) hours as needed for nausea or vomiting. 01/05/21  Yes Carroll Sage, PA-C  AMBULATORY NON FORMULARY MEDICATION Rolling Knee Scooter, please take Rx to medical supply store. 06/29/20   Myra Rude, MD  metroNIDAZOLE (FLAGYL) 500 MG tablet Take by mouth. 12/29/20 01/05/21  [provider]    Allergies    Bee venom  Review of Systems   Review of Systems  Constitutional: Positive for chills and fatigue. Negative for fever.  HENT: Positive for congestion. Negative for sore throat.   Respiratory: Positive for cough. Negative for shortness of breath.   Cardiovascular: Negative for chest pain.  Gastrointestinal: Positive for abdominal pain and diarrhea. Negative for nausea and vomiting.  Genitourinary: Negative for difficulty urinating, enuresis, flank pain, pelvic pain, vaginal bleeding and vaginal pain.  Musculoskeletal: Positive for back pain.  Skin: Negative for rash.  Neurological: Positive for headaches. Negative for dizziness.  Hematological: Does not bruise/bleed easily.    Physical Exam Updated Vital Signs BP (!) 134/91 (BP Location: Left Arm)   Pulse 96  Temp 98.4 F (36.9 C) (Oral)   Resp 14   Ht 5\' 4"  (1.626 m)   Wt 96.7 kg   LMP 12/25/2020   SpO2 99%   BMI 36.60 kg/m   Physical Exam Vitals and nursing note reviewed.  Constitutional:      General: She is not in acute distress.    Appearance: She is not ill-appearing.  HENT:     Head: Normocephalic and atraumatic.     Right Ear: Tympanic membrane, ear canal and external ear normal.     Left Ear: Tympanic membrane, ear canal and external ear normal.     Nose: Congestion present.     Comments: Patient has erythematous turbinates bilaterally.    Mouth/Throat:     Mouth: Mucous membranes are moist.     Pharynx: Oropharynx is  clear. No oropharyngeal exudate or posterior oropharyngeal erythema.  Eyes:     Conjunctiva/sclera: Conjunctivae normal.  Cardiovascular:     Rate and Rhythm: Normal rate and regular rhythm.     Pulses: Normal pulses.     Heart sounds: No murmur heard. No friction rub. No gallop.   Pulmonary:     Effort: No respiratory distress.     Breath sounds: No wheezing, rhonchi or rales.  Abdominal:     Palpations: Abdomen is soft.     Tenderness: There is no abdominal tenderness. There is no right CVA tenderness or left CVA tenderness.  Musculoskeletal:     Right lower leg: No edema.     Left lower leg: No edema.     Comments: Patient spine was palpated, it was nontender to palpation, no step-off or deformities present.  Patient is moving all 4 extremities at difficulty.  Skin:    General: Skin is warm and dry.  Neurological:     Mental Status: She is alert.  Psychiatric:        Mood and Affect: Mood normal.     ED Results / Procedures / Treatments   Labs (all labs ordered are listed, but only abnormal results are displayed) Labs Reviewed  URINALYSIS, ROUTINE W REFLEX MICROSCOPIC - Abnormal; Notable for the following components:      Result Value   Color, Urine AMBER (*)    APPearance CLOUDY (*)    Hgb urine dipstick SMALL (*)    Ketones, ur 40 (*)    Protein, ur >300 (*)    Leukocytes,Ua TRACE (*)    All other components within normal limits  URINALYSIS, MICROSCOPIC (REFLEX) - Abnormal; Notable for the following components:   Bacteria, UA MANY (*)    All other components within normal limits  SARS CORONAVIRUS 2 (TAT 6-24 HRS)  PREGNANCY, URINE    EKG None  Radiology No results found.  Procedures Procedures   Medications Ordered in ED Medications - No data to display  ED Course  I have reviewed the triage vital signs and the nursing notes.  Pertinent labs & imaging results that were available during my care of the patient were reviewed by me and considered in my  medical decision making (see chart for details).    MDM Rules/Calculators/A&P                          Initial impression-patient presents with URI-like symptoms.  She is alert, does not appear in acute distress, vital signs reassuring.  I suspect patient suffering from URI but cannot exclude possibility of a overlying UTI will obtain UA for  further evaluation.  Work-up-urine pregnancy negative.  UA shows 40 ketones, 300 protein, trace leukocytes, many bacteria,  21-50 squamous cells.  Reassessment patient is reassessed, has no complaints at this time, vital signs remained stable.  Discussed findings with patient, patient is comfortable for discharge.  Rule out- Low suspicion for systemic infection as patient is nontoxic-appearing, vital signs reassuring, no obvious source infection noted on exam.  Low suspicion for pneumonia as lung sounds are clear bilaterally, will defer imaging at this time.   I have low suspicion for PE as patient denies pleuritic chest pain, shortness of breath, patient is PERC. low suspicion for strep throat as oropharynx was visualized, no erythema or exudates noted.  Low suspicion patient would need  hospitalized due to viral infection or Covid as vital signs reassuring, patient is not in respiratory distress.  Low suspicion for UTI or pyelonephritis as patient denies urinary symptoms, no CVA tenderness on my exam.  UA is abnormal but I suspect this is secondary due to contamination as she has squamous cells present.  Will recommend she follows up with her PCP for repeat.   Plan-suspect patient suffering from a viral URI, will recommend over-the-counter pain medications, follow-up with PCP if Covid negative and symptoms persist for 1 week's time, follow-up with post Covid care if Covid positive.  Vital signs have remained stable, no indication for hospital admission.  Patient given at home care as well strict return precautions.  Patient verbalized that they understood  agreed to said plan.   Final Clinical Impression(s) / ED Diagnoses Final diagnoses:  Viral illness    Rx / DC Orders ED Discharge Orders         Ordered    ondansetron (ZOFRAN) 4 MG tablet  Every 8 hours PRN        01/05/21 1331           Barnie Del 01/05/21 1349    Terrilee Files, MD 01/05/21 1732

## 2021-01-05 NOTE — Discharge Instructions (Signed)
You have been seen here for URI like symptoms.  I recommend taking Tylenol for fever control and ibuprofen for pain control please follow dosing on the back of bottle.  Also given a prescription for Zofran please use as needed for nausea.  I recommend staying hydrated and if you do not an appetite, I recommend soups as this will provide you with fluids and calories.  Your Covid test is pending I recommend self quarantine until you get your results back on MyChart.    If you are Covid positive you must self quarantine for 5 days starting on symptom onset, if at the end of those 5 days you are feeling better you may return back to school/work, if you continue to have symptoms you must self quarantine for additional 5 days.  I would like you to contact "post Covid care" as they will provide you with information how to manage your Covid symptoms if you are Covid positive.  Or if you are Covid negative and continue of symptoms after 1 to 2 weeks you may follow-up with your primary care provider  Come back to the emergency department if you develop chest pain, shortness of breath, severe abdominal pain, uncontrolled nausea, vomiting, diarrhea.

## 2021-01-05 NOTE — ED Triage Notes (Signed)
pt arrives with c/o cold chills, cough, body aches, and headaches since Monday around 3 am. Pt is not vaccinated, has been around covid at work.

## 2021-01-06 ENCOUNTER — Telehealth: Payer: Self-pay | Admitting: Nurse Practitioner

## 2021-01-06 LAB — SARS CORONAVIRUS 2 (TAT 6-24 HRS): SARS Coronavirus 2: POSITIVE — AB

## 2021-01-06 NOTE — Telephone Encounter (Signed)
Done

## 2021-01-07 ENCOUNTER — Encounter: Payer: Self-pay | Admitting: Oncology

## 2021-01-07 ENCOUNTER — Telehealth: Payer: Self-pay | Admitting: Oncology

## 2021-01-07 NOTE — Telephone Encounter (Signed)
Called to Discuss with patient about Covid symptoms and the use of the monoclonal antibody infusion for those with mild to moderate Covid symptoms and at a high risk of hospitalization.     Pt is qualified for this infusion due to co-morbid conditions and/or a member of an at-risk group.     Patient Active Problem List   Diagnosis Date Noted  . Sprain of left foot 06/29/2020   BMI > 35 Current everyday smoker Unvaccinated  Patient declines infusion at this time. Symptoms tier reviewed as well as criteria for ending isolation. Preventative practices reviewed. Patient verbalized understanding.    Patient advised to call back if he/she decides that he/she does want to get infusion. Callback number to the infusion center given. Patient advised to go to Urgent care or ED with severe symptoms.   Durenda Hurt, NP 01/07/2021 12:24 PM

## 2021-10-03 ENCOUNTER — Other Ambulatory Visit: Payer: Self-pay

## 2021-10-03 ENCOUNTER — Encounter: Payer: Self-pay | Admitting: *Deleted

## 2021-10-03 ENCOUNTER — Emergency Department (HOSPITAL_BASED_OUTPATIENT_CLINIC_OR_DEPARTMENT_OTHER)
Admission: EM | Admit: 2021-10-03 | Discharge: 2021-10-03 | Disposition: A | Discharge status: Home / Self Care | Payer: 59 | Attending: Emergency Medicine | Admitting: Emergency Medicine

## 2021-10-03 ENCOUNTER — Ambulatory Visit
Admission: EM | Admit: 2021-10-03 | Discharge: 2021-10-03 | Disposition: A | Discharge status: Home / Self Care | Payer: 59

## 2021-10-03 ENCOUNTER — Encounter (HOSPITAL_BASED_OUTPATIENT_CLINIC_OR_DEPARTMENT_OTHER): Payer: Self-pay | Admitting: Obstetrics and Gynecology

## 2021-10-03 DIAGNOSIS — F17210 Nicotine dependence, cigarettes, uncomplicated: Secondary | ICD-10-CM | POA: Insufficient documentation

## 2021-10-03 DIAGNOSIS — L02419 Cutaneous abscess of limb, unspecified: Secondary | ICD-10-CM

## 2021-10-03 DIAGNOSIS — L02411 Cutaneous abscess of right axilla: Secondary | ICD-10-CM | POA: Diagnosis not present

## 2021-10-03 MED ORDER — KETOROLAC TROMETHAMINE 60 MG/2ML IM SOLN
60.0000 mg | Freq: Once | INTRAMUSCULAR | Status: AC
  Administered 2021-10-03: 60 mg via INTRAMUSCULAR
  Filled 2021-10-03: qty 2

## 2021-10-03 MED ORDER — DOXYCYCLINE HYCLATE 100 MG PO CAPS: 100.0000 mg | ORAL_CAPSULE | Freq: Two times a day (BID) | ORAL | 0 refills | Status: DC

## 2021-10-03 MED ORDER — LIDOCAINE HCL (PF) 1 % IJ SOLN
10.0000 mL | Freq: Once | INTRAMUSCULAR | Status: AC
  Administered 2021-10-03: 10 mL
  Filled 2021-10-03: qty 10

## 2021-10-03 MED ORDER — HYDROCODONE-ACETAMINOPHEN 5-325 MG PO TABS: 1.0000 | ORAL_TABLET | ORAL | 0 refills | Status: AC | PRN

## 2021-10-03 MED ORDER — HYDROMORPHONE HCL 1 MG/ML IJ SOLN
1.0000 mg | Freq: Once | INTRAMUSCULAR | Status: AC
  Administered 2021-10-03: 1 mg via INTRAMUSCULAR
  Filled 2021-10-03: qty 1

## 2021-10-03 NOTE — Discharge Instructions (Signed)
Please report to ED for further evaluation:  [facility]  [address] [place], [place]

## 2021-10-03 NOTE — ED Triage Notes (Signed)
Pt reports abscess Rt axilla since last Sunday. Site is painful and pat can not sleep due to pain.

## 2021-10-03 NOTE — Discharge Instructions (Addendum)
Pull packing out in 24 hours

## 2021-10-03 NOTE — ED Triage Notes (Signed)
Patient reports right axilla abscess x1 week. States she has been stiff and sore and has not been sleeping well due to pain. Sent from [facility] for evaluation due to pain

## 2021-10-03 NOTE — ED Provider Notes (Signed)
[facility]    CSN: [id number] Arrival date & time: 10/03/21  1215      History   Chief Complaint Chief Complaint  Patient presents with   Abscess    HPI Wendy Miles is a 30 y.o. female.   Patient here today for evaluation of abscess to her right axilla that has been present for one week. She report significant pain with any movement of her right arm. She does not report fever. She does not report any treatment for symptoms.   The history is provided by the patient.   Past Medical History:  Diagnosis Date   Anxiety     Patient Active Problem List   Diagnosis Date Noted   Sprain of left foot 06/29/2020    Past Surgical History:  Procedure Laterality Date   CESAREAN SECTION      OB History     Gravida  2   Para  1   Term  1   Preterm      AB  1   Living  1      SAB      IAB      Ectopic  1   Multiple      Live Births               Home Medications    Prior to Admission medications   Medication Sig Start Date End Date Taking? Authorizing Provider  AMBULATORY NON FORMULARY MEDICATION Rolling Knee Scooter, please take Rx to medical supply store. 06/29/20   Myra Rude, MD  ondansetron (ZOFRAN) 4 MG tablet Take 1 tablet (4 mg total) by mouth every 8 (eight) hours as needed for nausea or vomiting. 01/05/21   Carroll Sage, PA-C    Family History Family History  Problem Relation Age of Onset   Diabetes Mother    Heart disease Maternal Grandfather     Social History Social History   Tobacco Use   Smoking status: Every Day    Packs/day: 0.50    Types: Cigarettes   Smokeless tobacco: Never  Vaping Use   Vaping Use: Never used  Substance Use Topics   Alcohol use: Yes    Comment: rarely   Drug use: Yes    Types: Marijuana     Allergies   Bee venom   Review of Systems Review of Systems  Constitutional:  Negative for chills and fever.  Eyes:  Negative for discharge and redness.   Gastrointestinal:  Negative for nausea and vomiting.  Skin:  Negative for wound.    Physical Exam Triage Vital Signs ED Triage Vitals  Enc Vitals Group     BP      Pulse      Resp      Temp      Temp src      SpO2      Weight      Height      Head Circumference      Peak Flow      Pain Score      Pain Loc      Pain Edu?      Excl. in GC?    No data found.  Updated Vital Signs BP 111/81   Temp 98.8 F (37.1 C)   Resp 18   LMP 09/04/2021   SpO2 97%       Physical Exam Vitals and nursing note reviewed.  Constitutional:      General: She is not  in acute distress.    Appearance: Normal appearance. She is not ill-appearing.  HENT:     Head: Normocephalic and atraumatic.  Eyes:     Conjunctiva/sclera: Conjunctivae normal.  Cardiovascular:     Rate and Rhythm: Normal rate.  Pulmonary:     Effort: Pulmonary effort is normal.  Musculoskeletal:     Comments: Patient unable to lift her right arm on her own-- assistance required and when lifting arm patient continuously re-positions herself which limits exam significantly.  Skin:    General: Skin is warm and dry.     Comments: ? Area of induration to right axilla-- possibly encompassing large area, no fluctuance noted, patient does not tolerate exam  Neurological:     Mental Status: She is alert.  Psychiatric:        Mood and Affect: Mood normal.        Behavior: Behavior normal.        Thought Content: Thought content normal.     UC Treatments / Results  Labs (all labs ordered are listed, but only abnormal results are displayed) Labs Reviewed - No data to display  EKG   Radiology No results found.  Procedures Procedures (including critical care time)  Medications Ordered in UC Medications - No data to display  Initial Impression / Assessment and Plan / UC Course  I have reviewed the triage vital signs and the nursing notes.  Pertinent labs & imaging results that were available during my care of  the patient were reviewed by me and considered in my medical decision making (see chart for details).    Patient unable to tolerate exam of axilla, discussed that we would not be able to treat without a proper exam. Recommended evaluation in the ED as she will likely need pain control, specifically if I & D is necessary. She is agreeable to same.   Final Clinical Impressions(s) / UC Diagnoses   Final diagnoses:  Axillary abscess     Discharge Instructions      Please report to ED for further evaluation:  [facility] [place], [place]     ED Prescriptions   None    PDMP not reviewed this encounter.   Tomi Bamberger, PA-C 10/03/21 1314

## 2021-10-03 NOTE — ED Provider Notes (Signed)
[facility] EMERGENCY DEPT Provider Note   CSN: [id number] Arrival date & time: 10/03/21  1344     History Chief Complaint  Patient presents with   Abscess    Wendy Miles is a 30 y.o. female.  The history is provided by the patient. No language interpreter was used.  Abscess Location:  Shoulder/arm Shoulder/arm abscess location:  R axilla Size:  15 Abscess quality: redness and warmth   Duration:  2 days Progression:  Worsening Chronicity:  New Relieved by:  Nothing Worsened by:  Nothing Ineffective treatments:  None tried Risk factors: prior abscess   Pt complains of an abscess under her right arm.  Pt has had similar in the past     Past Medical History:  Diagnosis Date   Anxiety     Patient Active Problem List   Diagnosis Date Noted   Sprain of left foot 06/29/2020    Past Surgical History:  Procedure Laterality Date   CESAREAN SECTION       OB History     Gravida  2   Para  1   Term  1   Preterm      AB  1   Living  1      SAB      IAB      Ectopic  1   Multiple      Live Births              Family History  Problem Relation Age of Onset   Diabetes Mother    Heart disease Maternal Grandfather     Social History   Tobacco Use   Smoking status: Every Day    Packs/day: 0.50    Types: Cigarettes    Passive exposure: Never   Smokeless tobacco: Never  Vaping Use   Vaping Use: Never used  Substance Use Topics   Alcohol use: Yes    Comment: rarely   Drug use: Yes    Types: Marijuana    Home Medications Prior to Admission medications   Medication Sig Start Date End Date Taking? Authorizing Provider  doxycycline (VIBRAMYCIN) 100 MG capsule Take 1 capsule (100 mg total) by mouth 2 (two) times daily. 10/03/21  Yes Elson Areas, PA-C  HYDROcodone-acetaminophen (NORCO/VICODIN) 5-325 MG tablet Take 1 tablet by mouth every 4 (four) hours as needed for moderate pain. 10/03/21 10/03/22 Yes Elson Areas,  PA-C  AMBULATORY NON FORMULARY MEDICATION Rolling Knee Scooter, please take Rx to medical supply store. 06/29/20   Myra Rude, MD  ondansetron (ZOFRAN) 4 MG tablet Take 1 tablet (4 mg total) by mouth every 8 (eight) hours as needed for nausea or vomiting. 01/05/21   Carroll Sage, PA-C    Allergies    Bee venom  Review of Systems   Review of Systems  All other systems reviewed and are negative.  Physical Exam Updated Vital Signs BP 129/85 (BP Location: Left Arm)   Pulse (!) 101   Temp 98.6 F (37 C)   Resp 16   LMP 09/04/2021   SpO2 100%   Physical Exam Vitals reviewed.  Constitutional:      Appearance: Normal appearance.  HENT:     Head: Normocephalic.     Nose: Nose normal.  Cardiovascular:     Rate and Rhythm: Normal rate.     Pulses: Normal pulses.  Pulmonary:     Effort: Pulmonary effort is normal.  Musculoskeletal:        General:  Swelling and tenderness present.     Cervical back: Normal range of motion.     Comments: Approx 15 cm area of swelling and fluctuance   Skin:    General: Skin is warm.  Neurological:     General: No focal deficit present.     Mental Status: She is alert.  Psychiatric:        Mood and Affect: Mood normal.    ED Results / Procedures / Treatments   Labs (all labs ordered are listed, but only abnormal results are displayed) Labs Reviewed - No data to display  EKG None  Radiology No results found.  Procedures .Marland KitchenIncision and Drainage  Date/Time: 10/03/2021 5:03 PM Performed by: Elson Areas, PA-C Authorized by: Elson Areas, PA-C   Consent:    Consent obtained:  Verbal   Consent given by:  Patient   Risks, benefits, and alternatives were discussed: yes     Alternatives discussed:  No treatment Universal protocol:    Procedure explained and questions answered to patient or proxy's satisfaction: no     Patient identity confirmed:  Verbally with patient Location:    Type:  Abscess   Size:  15    Location:  Upper extremity   Upper extremity location:  Shoulder Anesthesia:    Anesthesia method:  Local infiltration   Local anesthetic:  Lidocaine 1% w/o epi Procedure type:    Complexity:  Complex Procedure details:    Incision types:  Single straight   Wound management:  Probed and deloculated   Drainage amount:  Copious   Packing materials:  1/2 in iodoform gauze Post-procedure details:    Procedure completion:  Tolerated well, no immediate complications Comments:     Large amount of drainage,  area irrigatted,  packing placed due to continued drainage    Medications Ordered in ED Medications  lidocaine (PF) (XYLOCAINE) 1 % injection 10 mL (10 mLs Infiltration Given by Other 10/03/21 1533)  HYDROmorphone (DILAUDID) injection 1 mg (1 mg Intramuscular Given 10/03/21 1534)  ketorolac (TORADOL) injection 60 mg (60 mg Intramuscular Given 10/03/21 1534)    ED Course  I have reviewed the triage vital signs and the nursing notes.  Pertinent labs & imaging results that were available during my care of the patient were reviewed by me and considered in my medical decision making (see chart for details).    MDM Rules/Calculators/A&P                           MDM:  Pt counseled on abscesses.  Pt advised to removed packing in 24 hours.  Return if any problems.  Final Clinical Impression(s) / ED Diagnoses Final diagnoses:  Axillary abscess    Rx / DC Orders ED Discharge Orders          Ordered    doxycycline (VIBRAMYCIN) 100 MG capsule  2 times daily        10/03/21 1654    HYDROcodone-acetaminophen (NORCO/VICODIN) 5-325 MG tablet  Every 4 hours PRN        10/03/21 1654          An After Visit Summary was printed and given to the patient.    Elson Areas, PA-C 10/03/21 1706    Ernie Avena, MD 10/03/21 [phone]

## 2022-01-11 IMAGING — DX DG FOOT COMPLETE 3+V*L*
3 series · 3 of 3 positions shown · non-contrast
Comparison: None.

CLINICAL DATA: Midfoot pain after fall

EXAM:
LEFT FOOT - COMPLETE 3+ VIEW

[foot ap]
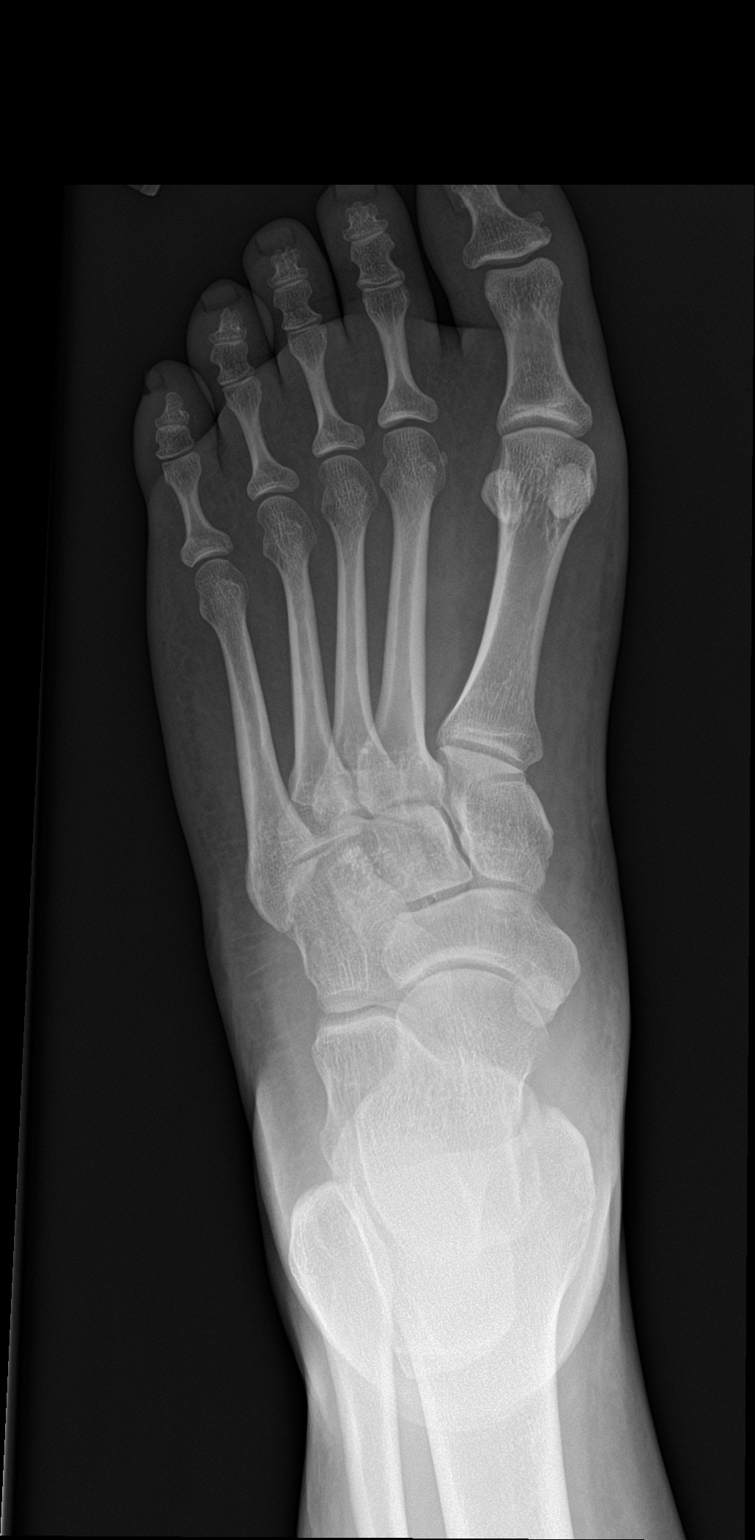

[foot obl]
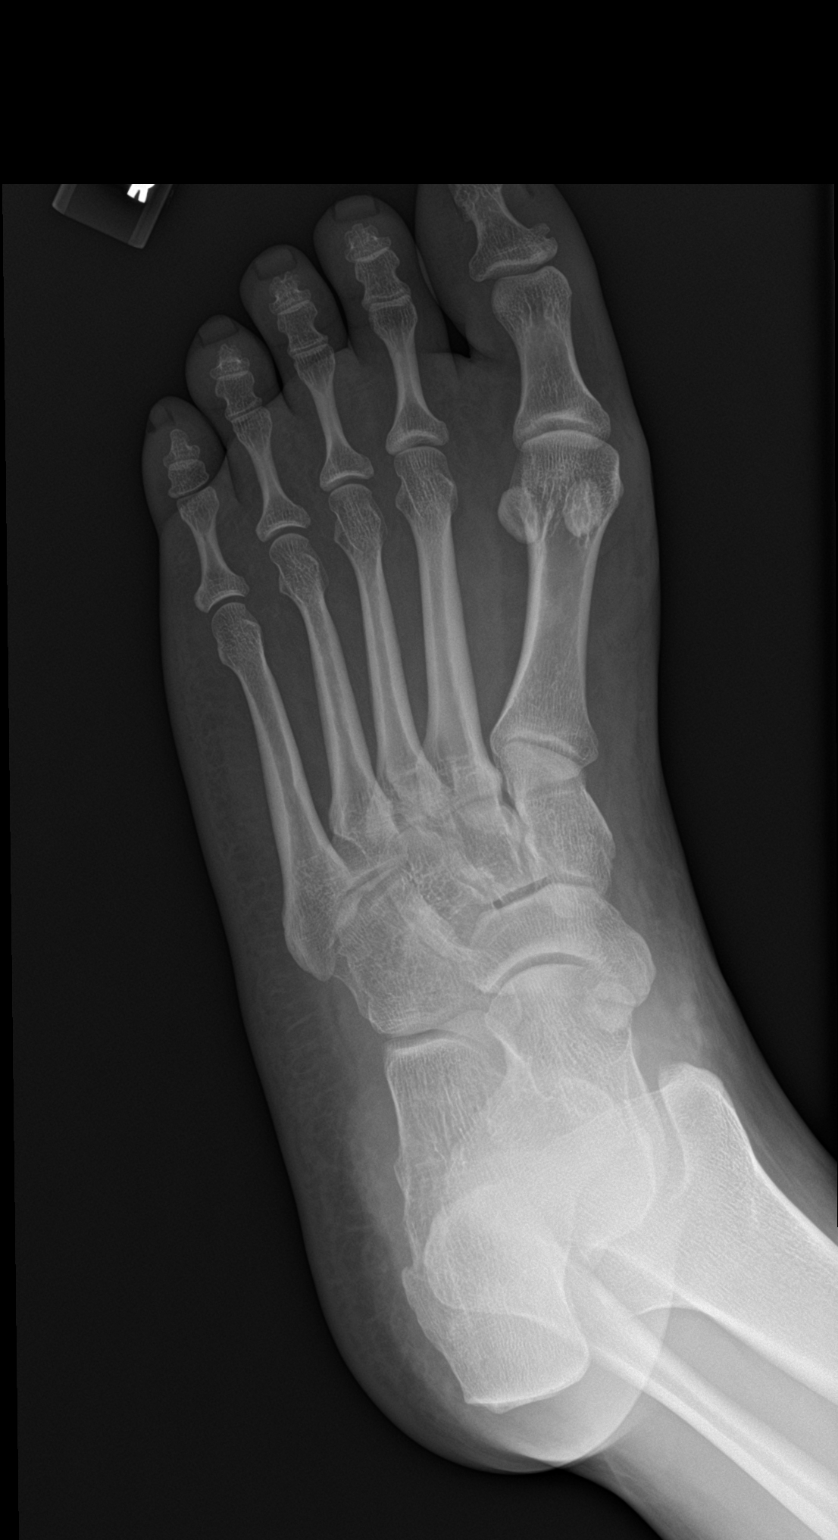

[foot lat]
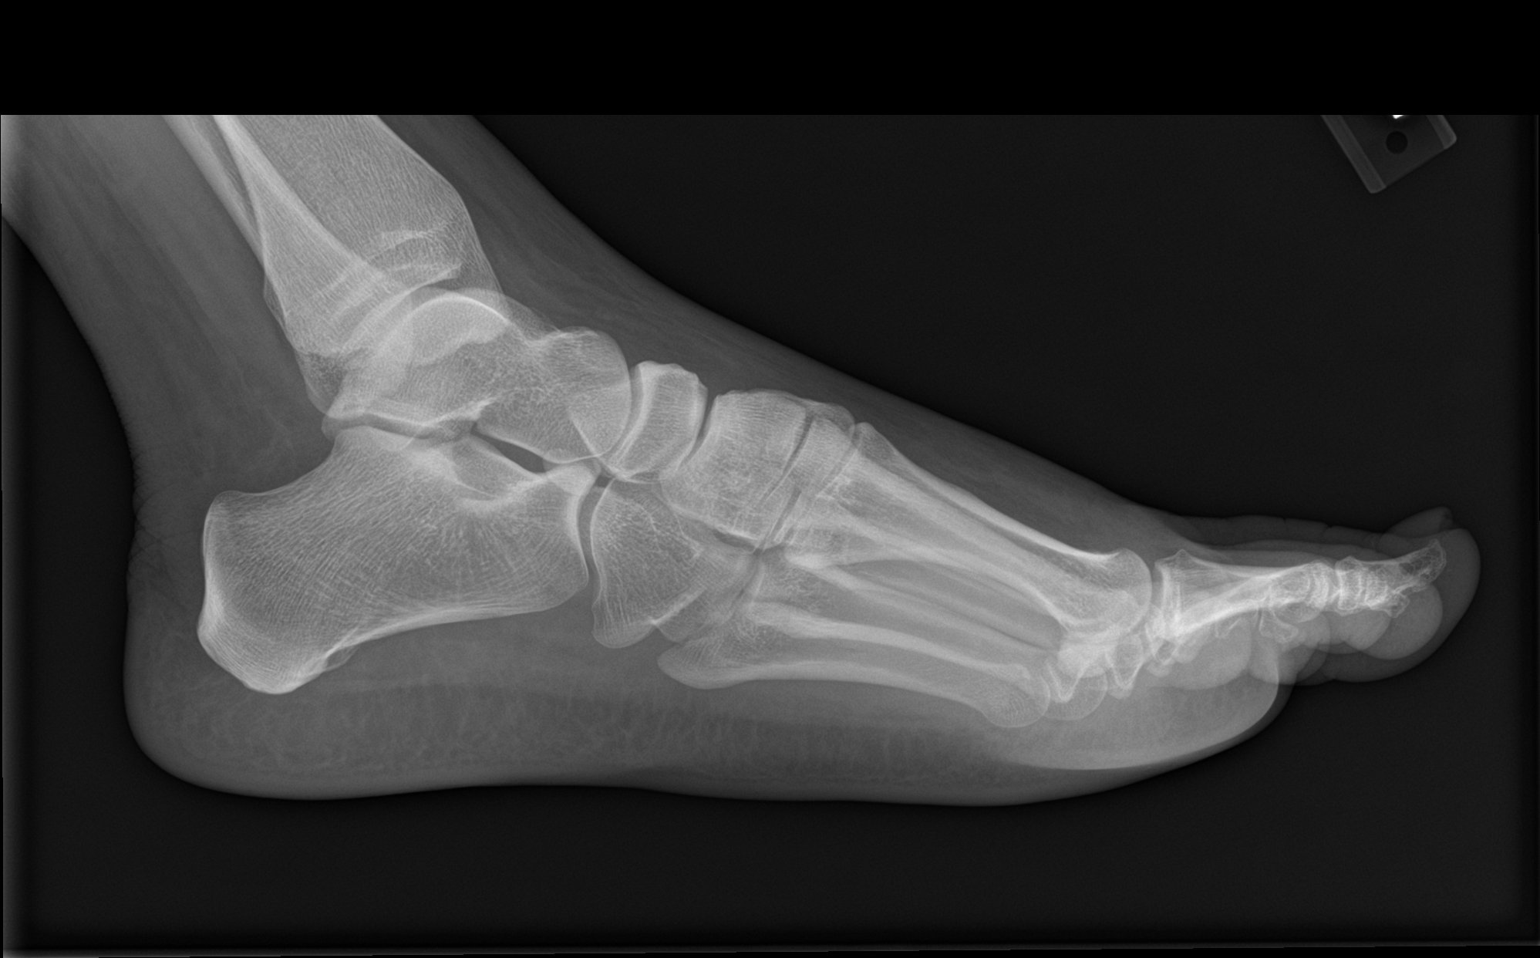

[3 of 3 positions shown; findings below may reference images not displayed]

FINDINGS: There is no evidence of fracture or dislocation. There is no
evidence of arthropathy or other focal bone abnormality. Soft
tissues are unremarkable.
IMPRESSION: Negative.

## 2022-01-11 IMAGING — DX DG TIBIA/FIBULA 2V*R*
2 series · 2 of 2 positions shown · non-contrast
Comparison: None.

CLINICAL DATA: Anterolateral lower leg abrasion, pain

EXAM:
RIGHT TIBIA AND FIBULA - 2 VIEW

[tibia ap]
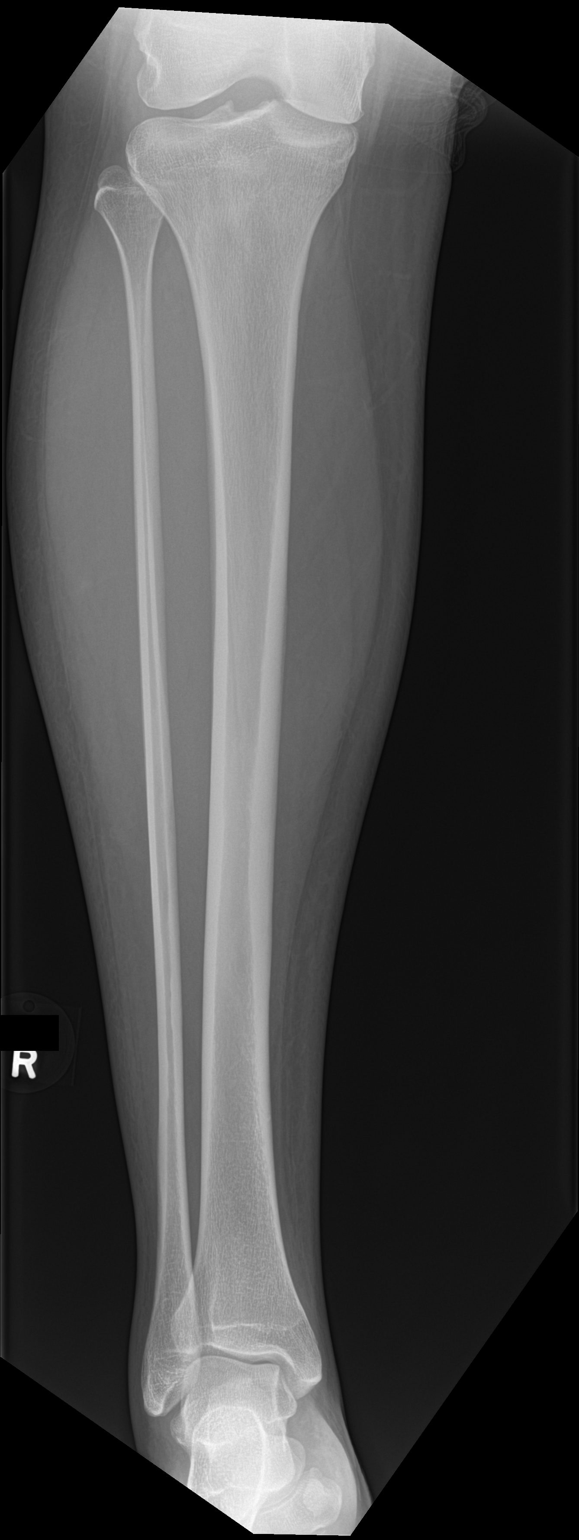

[tibia lat]
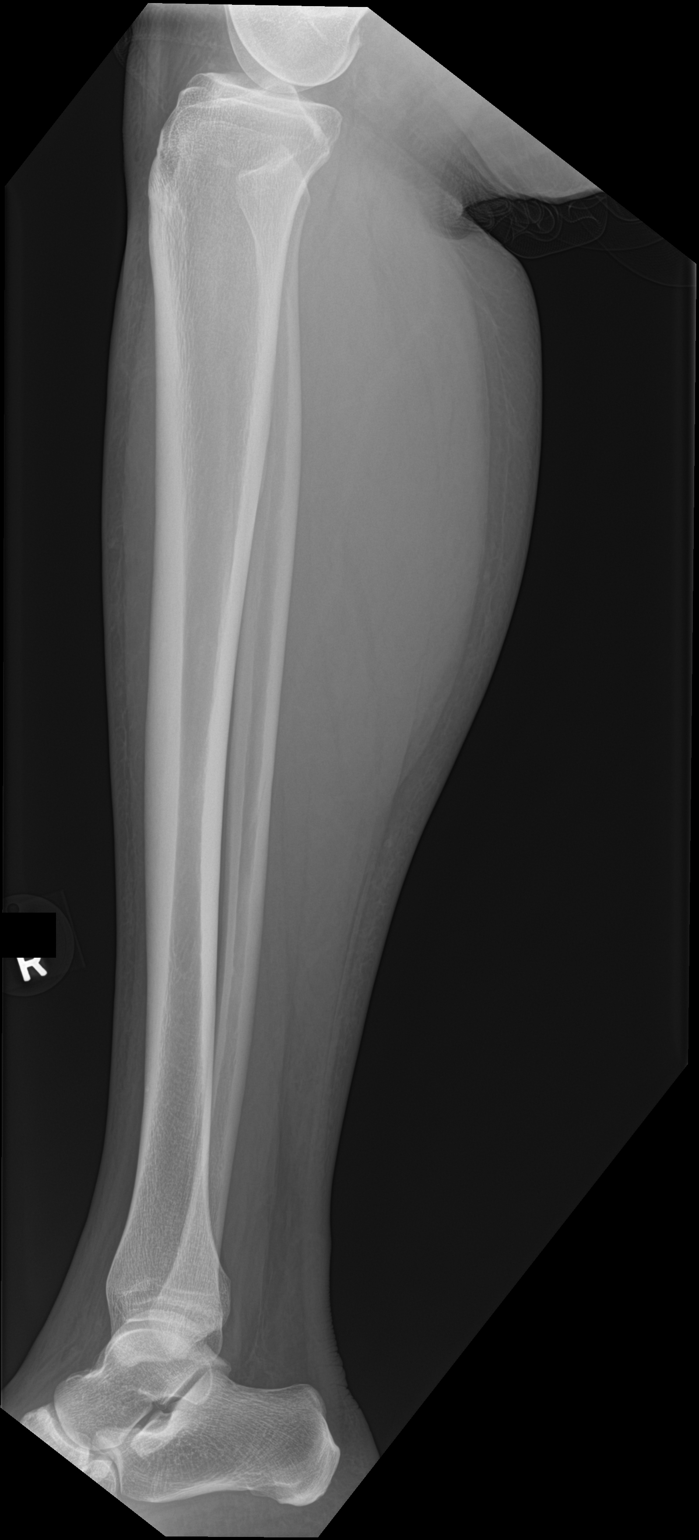

[2 of 2 positions shown; findings below may reference images not displayed]

FINDINGS: There is no evidence of fracture or other focal bone lesions. Soft
tissues are unremarkable.
IMPRESSION: Negative.

## 2022-02-07 ENCOUNTER — Ambulatory Visit
Admission: EM | Admit: 2022-02-07 | Discharge: 2022-02-07 | Disposition: A | Discharge status: Home / Self Care | Payer: Managed Care, Other (non HMO) | Attending: Internal Medicine | Admitting: Internal Medicine

## 2022-02-07 ENCOUNTER — Encounter: Payer: Self-pay | Admitting: Emergency Medicine

## 2022-02-07 ENCOUNTER — Other Ambulatory Visit: Payer: Self-pay

## 2022-02-07 DIAGNOSIS — J020 Streptococcal pharyngitis: Secondary | ICD-10-CM | POA: Diagnosis not present

## 2022-02-07 LAB — POCT RAPID STREP A (OFFICE): Rapid Strep A Screen: POSITIVE — AB

## 2022-02-07 MED ORDER — AMOXICILLIN-POT CLAVULANATE 875-125 MG PO TABS: 1.0000 | ORAL_TABLET | Freq: Two times a day (BID) | ORAL | 0 refills | Status: AC

## 2022-02-07 NOTE — ED Triage Notes (Signed)
Sore throat since saturday

## 2022-02-07 NOTE — ED Provider Notes (Signed)
?[facility] ? ? ? ?CSN: [id number] ?Arrival date & time: 02/07/22  1019 ? ? ?  ? ?History   ?Chief Complaint ?No chief complaint on file. ? ? ?HPI ?Wendy Miles is a 31 y.o. female.  ? ?Patient presents with sore throat that has been present for 3 days.  Patient denies any associated fevers or upper respiratory symptoms.  Denies any known sick contacts.  Denies chest pain, shortness of breath, nausea, vomiting, diarrhea, abdominal pain.  She has taken Tylenol for symptoms with minimal movement. ? ? ? ?Past Medical History:  ?Diagnosis Date  ? Anxiety   ? ? ?Patient Active Problem List  ? Diagnosis Date Noted  ? Sprain of left foot 06/29/2020  ? ? ?Past Surgical History:  ?Procedure Laterality Date  ? CESAREAN SECTION    ? ? ?OB History   ? ? Gravida  ?2  ? Para  ?1  ? Term  ?1  ? Preterm  ?   ? AB  ?1  ? Living  ?1  ?  ? ? SAB  ?   ? IAB  ?   ? Ectopic  ?1  ? Multiple  ?   ? Live Births  ?   ?   ?  ?  ? ? ? ?Home Medications   ? ?Prior to Admission medications   ?Medication Sig Start Date End Date Taking? Authorizing Provider  ?amoxicillin-clavulanate (AUGMENTIN) 875-125 MG tablet Take 1 tablet by mouth every 12 (twelve) hours. 02/07/22  Yes Gustavus Bryant, FNP  ?AMBULATORY NON FORMULARY MEDICATION Rolling Knee Scooter, please take Rx to medical supply store. 06/29/20   Myra Rude, MD  ?HYDROcodone-acetaminophen (NORCO/VICODIN) 5-325 MG tablet Take 1 tablet by mouth every 4 (four) hours as needed for moderate pain. 10/03/21 10/03/22  Elson Areas, PA-C  ?ondansetron (ZOFRAN) 4 MG tablet Take 1 tablet (4 mg total) by mouth every 8 (eight) hours as needed for nausea or vomiting. 01/05/21   Carroll Sage, PA-C  ? ? ?Family History ?Family History  ?Problem Relation Age of Onset  ? Diabetes Mother   ? Heart disease Maternal Grandfather   ? ? ?Social History ?Social History  ? ?Tobacco Use  ? Smoking status: Every Day  ?  Packs/day: 0.50  ?  Types: Cigarettes  ?  Passive exposure: Never  ?  Smokeless tobacco: Never  ?Vaping Use  ? Vaping Use: Never used  ?Substance Use Topics  ? Alcohol use: Yes  ?  Comment: rarely  ? Drug use: Yes  ?  Types: Marijuana  ? ? ? ?Allergies   ?Bee venom ? ? ?Review of Systems ?Review of Systems ?Per HPI ? ?Physical Exam ?Triage Vital Signs ?ED Triage Vitals  ?Enc Vitals Group  ?   BP 02/07/22 1103 127/87  ?   Pulse Rate 02/07/22 1103 79  ?   Resp 02/07/22 1103 16  ?   Temp 02/07/22 1103 98.1 ?F (36.7 ?C)  ?   Temp Source 02/07/22 1103 Oral  ?   SpO2 02/07/22 1103 98 %  ?   Weight --   ?   Height --   ?   Head Circumference --   ?   Peak Flow --   ?   Pain Score 02/07/22 1105 8  ?   Pain Loc --   ?   Pain Edu? --   ?   Excl. in GC? --   ? ?No data found. ? ?Updated Vital  Signs ?BP 127/87 (BP Location: Left Arm)   Pulse 79   Temp 98.1 ?F (36.7 ?C) (Oral)   Resp 16   SpO2 98%  ? ?Visual Acuity ?Right Eye Distance:   ?Left Eye Distance:   ?Bilateral Distance:   ? ?Right Eye Near:   ?Left Eye Near:    ?Bilateral Near:    ? ?Physical Exam ?Constitutional:   ?   General: She is not in acute distress. ?   Appearance: Normal appearance. She is not toxic-appearing or diaphoretic.  ?HENT:  ?   Head: Normocephalic and atraumatic.  ?   Right Ear: Tympanic membrane and ear canal normal.  ?   Left Ear: Tympanic membrane and ear canal normal.  ?   Nose: Nose normal.  ?   Mouth/Throat:  ?   Mouth: Mucous membranes are moist.  ?   Pharynx: Posterior oropharyngeal erythema present. No oropharyngeal exudate.  ?   Tonsils: Tonsillar exudate present. No tonsillar abscesses. 1+ on the right. 1+ on the left.  ?Eyes:  ?   Extraocular Movements: Extraocular movements intact.  ?   Conjunctiva/sclera: Conjunctivae normal.  ?   Pupils: Pupils are equal, round, and reactive to light.  ?Cardiovascular:  ?   Rate and Rhythm: Normal rate and regular rhythm.  ?   Pulses: Normal pulses.  ?   Heart sounds: Normal heart sounds.  ?Pulmonary:  ?   Effort: Pulmonary effort is normal. No respiratory distress.   ?   Breath sounds: Normal breath sounds.  ?Neurological:  ?   General: No focal deficit present.  ?   Mental Status: She is alert and oriented to person, place, and time. Mental status is at baseline.  ?Psychiatric:     ?   Mood and Affect: Mood normal.     ?   Behavior: Behavior normal.     ?   Thought Content: Thought content normal.     ?   Judgment: Judgment normal.  ? ? ? ?UC Treatments / Results  ?Labs ?(all labs ordered are listed, but only abnormal results are displayed) ?Labs Reviewed  ?POCT RAPID STREP A (OFFICE) - Abnormal; Notable for the following components:  ?    Result Value  ? Rapid Strep A Screen Positive (*)   ? All other components within normal limits  ? ? ?EKG ? ? ?Radiology ?No results found. ? ?Procedures ?Procedures (including critical care time) ? ?Medications Ordered in UC ?Medications - No data to display ? ?Initial Impression / Assessment and Plan / UC Course  ?I have reviewed the triage vital signs and the nursing notes. ? ?Pertinent labs & imaging results that were available during my care of the patient were reviewed by me and considered in my medical decision making (see chart for details). ? ?  ? ?Rapid strep test was positive.  Will treat with Augmentin antibiotic given patient's report of recent antibiotics approximately 2 months ago.  No signs of peritonsillar abscess on exam.  Discussed supportive care and symptom management with patient.  Discussed strict return precautions.  Patient verbalized understanding and was agreeable with plan. ?Final Clinical Impressions(s) / UC Diagnoses  ? ?Final diagnoses:  ?Strep pharyngitis  ? ? ? ?Discharge Instructions   ? ?  ?You have strep throat which is being treated with an antibiotic.  Follow-up if symptoms persist or worsen. ? ? ? ?ED Prescriptions   ? ? Medication Sig Dispense Auth. Provider  ? amoxicillin-clavulanate (AUGMENTIN) 875-125 MG tablet Take 1  tablet by mouth every 12 (twelve) hours. 14 tablet Ervin Knack E, [place]  ? ?   ? ?PDMP not reviewed this encounter. ?  ?Gustavus Bryant, [place] ?02/07/22 1140 ? ?

## 2022-02-07 NOTE — Discharge Instructions (Signed)
You have strep throat which is being treated with an antibiotic.  Follow-up if symptoms persist or worsen. °

## 2022-05-15 ENCOUNTER — Emergency Department (HOSPITAL_BASED_OUTPATIENT_CLINIC_OR_DEPARTMENT_OTHER)
Admission: EM | Admit: 2022-05-15 | Discharge: 2022-05-16 | Disposition: A | Discharge status: Home / Self Care | Payer: 59 | Attending: Emergency Medicine | Admitting: Emergency Medicine

## 2022-05-15 ENCOUNTER — Other Ambulatory Visit: Payer: Self-pay

## 2022-05-15 ENCOUNTER — Encounter (HOSPITAL_BASED_OUTPATIENT_CLINIC_OR_DEPARTMENT_OTHER): Payer: Self-pay

## 2022-05-15 DIAGNOSIS — L0291 Cutaneous abscess, unspecified: Secondary | ICD-10-CM

## 2022-05-15 DIAGNOSIS — L02411 Cutaneous abscess of right axilla: Secondary | ICD-10-CM | POA: Insufficient documentation

## 2022-05-15 MED ORDER — HYDROCODONE-ACETAMINOPHEN 5-325 MG PO TABS
1.0000 | ORAL_TABLET | Freq: Once | ORAL | Status: AC
  Administered 2022-05-15: 1 via ORAL
  Filled 2022-05-15: qty 1

## 2022-05-15 MED ORDER — LIDOCAINE-EPINEPHRINE (PF) 2 %-1:200000 IJ SOLN
INTRAMUSCULAR | Status: AC
  Administered 2022-05-16: 20 mL
  Filled 2022-05-15: qty 20

## 2022-05-15 MED ORDER — LIDOCAINE-EPINEPHRINE 2 %-1:100000 IJ SOLN: 20.0000 mL | Freq: Once | INTRAMUSCULAR | Status: DC

## 2022-05-15 NOTE — ED Triage Notes (Signed)
Pt presents to the ED with abscess under right arm that she first noticed 4-5 days ago. States that she has recurrent abscesses and has had to have them drained. Denies fevers or chills at home. Pt A&Ox4 at time of triage. VSS.   Last took 800mg  ibuprofen at 1100 with minimal relief.

## 2022-05-16 NOTE — ED Provider Notes (Signed)
[facility] EMERGENCY DEPT Provider Note   CSN: [id number] Arrival date & time: 05/15/22  2117     History  Chief Complaint  Patient presents with   Abscess    KAMYA REINEKE is a 31 y.o. female.  HPI     This is a 31 year old female who presents with an abscess under her right arm.  Patient reports history of recurrent abscesses in her axilla and groin.  She is unclear whether she has a diagnosis of hidradenitis suppurativa but states that "I was supposed to have surgery as a teenager."  She states she has noted pain and swelling for the last 4 to 5 days.  No fevers.  No drainage.  Home Medications Prior to Admission medications   Medication Sig Start Date End Date Taking? Authorizing Provider  AMBULATORY NON FORMULARY MEDICATION Rolling Knee Scooter, please take Rx to medical supply store. 06/29/20   Myra Rude, MD  amoxicillin-clavulanate (AUGMENTIN) 875-125 MG tablet Take 1 tablet by mouth every 12 (twelve) hours. 02/07/22   Gustavus Bryant, FNP  HYDROcodone-acetaminophen (NORCO/VICODIN) 5-325 MG tablet Take 1 tablet by mouth every 4 (four) hours as needed for moderate pain. 10/03/21 10/03/22  Elson Areas, PA-C  ondansetron (ZOFRAN) 4 MG tablet Take 1 tablet (4 mg total) by mouth every 8 (eight) hours as needed for nausea or vomiting. 01/05/21   Carroll Sage, PA-C      Allergies    Bee venom    Review of Systems   Review of Systems  Skin:  Negative for color change.       Skin abscess  All other systems reviewed and are negative.   Physical Exam Updated Vital Signs BP 136/87   Pulse 88   Temp 98.3 F (36.8 C)   Resp 18   Ht 1.626 m (5\' 4" )   Wt 95.3 kg   LMP 04/23/2022 (Exact Date)   SpO2 100%   BMI 36.05 kg/m  Physical Exam Vitals and nursing note reviewed.  Constitutional:      Appearance: She is well-developed. She is obese.  HENT:     Head: Normocephalic and atraumatic.  Eyes:     Pupils: Pupils are equal, round, and  reactive to light.  Cardiovascular:     Rate and Rhythm: Normal rate and regular rhythm.  Pulmonary:     Effort: Pulmonary effort is normal. No respiratory distress.  Abdominal:     Palpations: Abdomen is soft.  Musculoskeletal:     Cervical back: Neck supple.  Skin:    General: Skin is warm and dry.     Comments: Large area of fluctuance and induration under the right arm and axilla area, prior scarring noted, no overlying erythema  Neurological:     Mental Status: She is alert and oriented to person, place, and time.  Psychiatric:        Mood and Affect: Mood normal.     ED Results / Procedures / Treatments   Labs (all labs ordered are listed, but only abnormal results are displayed) Labs Reviewed - No data to display  EKG None  Radiology No results found.  Procedures .Marland KitchenIncision and Drainage  Date/Time: 05/16/2022 1:22 AM  Performed by: Shon Baton, MD Authorized by: Shon Baton, MD   Consent:    Consent obtained:  Verbal   Consent given by:  Patient   Risks, benefits, and alternatives were discussed: yes     Risks discussed:  Incomplete drainage and pain  Alternatives discussed:  No treatment Universal protocol:    Patient identity confirmed:  Verbally with patient Location:    Type:  Abscess   Size:  5x5   Location:  Upper extremity   Upper extremity location: Axilla. Pre-procedure details:    Skin preparation:  Chlorhexidine Anesthesia:    Anesthesia method:  Local infiltration   Local anesthetic:  Lidocaine 2% WITH epi Procedure type:    Complexity:  Complex Procedure details:    Ultrasound guidance: no     Needle aspiration: no     Incision types:  Stab incision   Incision depth:  Dermal   Wound management:  Probed and deloculated and extensive cleaning   Drainage:  Purulent   Drainage amount:  Copious   Packing materials:  1/4 in iodoform gauze Post-procedure details:    Procedure completion:  Tolerated     Medications  Ordered in ED Medications  HYDROcodone-acetaminophen (NORCO/VICODIN) 5-325 MG per tablet 1 tablet (1 tablet Oral Given 05/15/22 2349)  lidocaine-EPINEPHrine (XYLOCAINE W/EPI) 2 %-1:200000 (PF) injection (20 mLs  Given 05/16/22 0000)    ED Course/ Medical Decision Making/ A&P                           Medical Decision Making Risk Prescription drug management.   This patient presents to the ED for concern of abscess, this involves an extensive number of treatment options, and is a complaint that carries with it a high risk of complications and morbidity.  I considered the following differential and admission for this acute, potentially life threatening condition.  The differential diagnosis includes abscess, cellulitis, hidradenitis suppurativa  MDM:    This is a 31 year old female who presents with an abscess of the right axilla.  She is nontoxic and vital signs are reassuring.  She has a large abscess clinically evident on exam.  No overlying cellulitis.  This was drained at the bedside with relief of symptoms.  Given her history, highly suspect hidradenitis suppurativa.  Recommend she follow-up with her primary doctor for dermatology referral.  No indication for antibiotics.  (Labs, imaging, consults)  Labs: I Ordered, and personally interpreted labs.  The pertinent results include: None  Imaging Studies ordered: I ordered imaging studies including none I independently visualized and interpreted imaging. I agree with the radiologist interpretation  Additional history obtained from other at bedside.  External records from outside source obtained and reviewed including prior evaluations  Cardiac Monitoring: The patient was maintained on a cardiac monitor.  I personally viewed and interpreted the cardiac monitored which showed an underlying rhythm of: Sinus rhythm  Reevaluation: After the interventions noted above, I reevaluated the patient and found that they have :improved  Social  Determinants of Health: Lives independently  Disposition: Discharge  Co morbidities that complicate the patient evaluation  Past Medical History:  Diagnosis Date   Anxiety      Medicines Meds ordered this encounter  Medications   HYDROcodone-acetaminophen (NORCO/VICODIN) 5-325 MG per tablet 1 tablet   DISCONTD: lidocaine-EPINEPHrine (XYLOCAINE W/EPI) 2 %-1:100000 (with pres) injection 20 mL   lidocaine-EPINEPHrine (XYLOCAINE W/EPI) 2 %-1:200000 (PF) injection    Coralyn Helling M: cabinet override    I have reviewed the patients home medicines and have made adjustments as needed  Problem List / ED Course: Problem List Items Addressed This Visit   None Visit Diagnoses     Abscess    -  Primary  Final Clinical Impression(s) / ED Diagnoses Final diagnoses:  Abscess    Rx / DC Orders ED Discharge Orders     None         Chauntel Windsor, Mayer Masker, MD 05/16/22 [phone]

## 2023-01-23 ENCOUNTER — Encounter (HOSPITAL_BASED_OUTPATIENT_CLINIC_OR_DEPARTMENT_OTHER): Payer: Self-pay | Admitting: Emergency Medicine

## 2023-01-23 ENCOUNTER — Other Ambulatory Visit: Payer: Self-pay

## 2023-01-23 ENCOUNTER — Emergency Department (HOSPITAL_BASED_OUTPATIENT_CLINIC_OR_DEPARTMENT_OTHER)
Admission: EM | Admit: 2023-01-23 | Discharge: 2023-01-23 | Disposition: A | Discharge status: Home / Self Care | Payer: 59 | Attending: Emergency Medicine | Admitting: Emergency Medicine

## 2023-01-23 ENCOUNTER — Emergency Department (HOSPITAL_BASED_OUTPATIENT_CLINIC_OR_DEPARTMENT_OTHER): Payer: 59

## 2023-01-23 DIAGNOSIS — S50319A Abrasion of unspecified elbow, initial encounter: Secondary | ICD-10-CM | POA: Insufficient documentation

## 2023-01-23 DIAGNOSIS — S0990XA Unspecified injury of head, initial encounter: Secondary | ICD-10-CM | POA: Diagnosis present

## 2023-01-23 DIAGNOSIS — S060X0A Concussion without loss of consciousness, initial encounter: Secondary | ICD-10-CM

## 2023-01-23 DIAGNOSIS — I10 Essential (primary) hypertension: Secondary | ICD-10-CM | POA: Diagnosis not present

## 2023-01-23 DIAGNOSIS — S80219A Abrasion, unspecified knee, initial encounter: Secondary | ICD-10-CM | POA: Insufficient documentation

## 2023-01-23 DIAGNOSIS — Z79899 Other long term (current) drug therapy: Secondary | ICD-10-CM | POA: Insufficient documentation

## 2023-01-23 LAB — PREGNANCY, URINE: Preg Test, Ur: NEGATIVE

## 2023-01-23 MED ORDER — HYDRALAZINE HCL 25 MG PO TABS
25.0000 mg | ORAL_TABLET | Freq: Once | ORAL | Status: AC
  Administered 2023-01-23: 25 mg via ORAL
  Filled 2023-01-23: qty 1

## 2023-01-23 MED ORDER — AMLODIPINE BESYLATE 5 MG PO TABS: 5.0000 mg | ORAL_TABLET | Freq: Every day | ORAL | 0 refills | Status: AC

## 2023-01-23 NOTE — Discharge Instructions (Addendum)
You were diagnosed with a likely concussion in the ER today.  Most people recover fully in 2 to 4 weeks.  If you are not feeling better after 2 weeks please call to make an appointment with the concussion clinic at sports medicine number above.  Please note that your blood pressure was also high today.  This may be a response to stress and pain.  However it is important that you follow-up as an outpatient with your doctor for high blood pressure.  I prescribed you a low-dose blood pressure medicine called amlodipine which she can take once a day in the meantime until you see your doctors office.
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
## 2023-03-06 ENCOUNTER — Encounter: Payer: Self-pay | Admitting: *Deleted

## 2024-02-05 ENCOUNTER — Ambulatory Visit
Admission: EM | Admit: 2024-02-05 | Discharge: 2024-02-05 | Disposition: A | Discharge status: Home / Self Care | Attending: Emergency Medicine | Admitting: Emergency Medicine

## 2024-02-05 DIAGNOSIS — I10 Essential (primary) hypertension: Secondary | ICD-10-CM | POA: Insufficient documentation

## 2024-02-05 DIAGNOSIS — N898 Other specified noninflammatory disorders of vagina: Secondary | ICD-10-CM

## 2024-02-05 DIAGNOSIS — J45909 Unspecified asthma, uncomplicated: Secondary | ICD-10-CM | POA: Insufficient documentation

## 2024-02-05 DIAGNOSIS — Z113 Encounter for screening for infections with a predominantly sexual mode of transmission: Secondary | ICD-10-CM | POA: Diagnosis not present

## 2024-02-05 DIAGNOSIS — F419 Anxiety disorder, unspecified: Secondary | ICD-10-CM | POA: Insufficient documentation

## 2024-02-05 NOTE — ED Triage Notes (Signed)
 Pt presents with yeast infection symptoms that onset about a week ago.

## 2024-02-05 NOTE — Discharge Instructions (Addendum)
 Check my chart for results. Avoid sexual activity until results,treatment known and completed. Safe sex with all future sexual activity. We have sent testing for sexually transmitted infections. We will notify you of any positive results once they are received. If required, we will prescribe any medications you might need.   Follow up with PCP,recheck BP. Return as needed.

## 2024-02-05 NOTE — ED Provider Notes (Signed)
 [facility]    CSN: [id number] Arrival date & time: 02/05/24  1326      History   Chief Complaint Chief Complaint  Patient presents with   Vaginitis    HPI Wendy Miles is a 33 y.o. female.   Anne-Marie Sui, presents to urgent care for evaluation of vaginal discharge, itching x 1 week. Pt states she has had a new partner  Last menstrual period 01/30/2024  PMH: anxiety, asthma  The history is provided by the patient. No language interpreter was used.    Past Medical History:  Diagnosis Date   Anxiety     Patient Active Problem List   Diagnosis Date Noted   Anxiety 02/05/2024   Asthma without status asthmaticus 02/05/2024   Vaginal discharge 02/05/2024   Screening examination for STD (sexually transmitted disease) 02/05/2024   Elevated blood pressure reading with diagnosis of hypertension 02/05/2024   Pap smear abnormality of cervix/human papillomavirus (HPV) positive 12/29/2020   Sprain of left foot 06/29/2020    Past Surgical History:  Procedure Laterality Date   CESAREAN SECTION      OB History     Gravida  2   Para  1   Term  1   Preterm      AB  1   Living  1      SAB      IAB      Ectopic  1   Multiple      Live Births               Home Medications    Prior to Admission medications   Medication Sig Start Date End Date Taking? Authorizing Provider  fluconazole (DIFLUCAN) 150 MG tablet Take 1 tablet once for yeast infection; may repeat once in 3 days if needed 02/20/23  Yes [provider]  AMBULATORY NON FORMULARY MEDICATION Rolling Knee Scooter, please take Rx to medical supply store. 06/29/20   Myra Rude, MD  amLODipine (NORVASC) 5 MG tablet Take 1 tablet (5 mg total) by mouth daily for 30 doses. 01/23/23 02/22/23  Terald Sleeper, MD  amoxicillin-clavulanate (AUGMENTIN) 875-125 MG tablet Take 1 tablet by mouth every 12 (twelve) hours. 02/07/22   Gustavus Bryant, FNP  ondansetron (ZOFRAN) 4 MG tablet  Take 1 tablet (4 mg total) by mouth every 8 (eight) hours as needed for nausea or vomiting. 01/05/21   Carroll Sage, PA-C    Family History Family History  Problem Relation Age of Onset   Diabetes Mother    Heart disease Maternal Grandfather     Social History Social History   Tobacco Use   Smoking status: Every Day    Current packs/day: 0.50    Types: Cigarettes    Passive exposure: Never   Smokeless tobacco: Never  Vaping Use   Vaping status: Never Used  Substance Use Topics   Alcohol use: Yes    Comment: rarely   Drug use: Yes    Types: Marijuana     Allergies   Bee venom   Review of Systems Review of Systems  Constitutional:  Negative for fever.  Gastrointestinal:  Negative for abdominal pain, nausea and vomiting.  Genitourinary:  Positive for vaginal discharge. Negative for dysuria.  All other systems reviewed and are negative.    Physical Exam Triage Vital Signs ED Triage Vitals  Encounter Vitals Group     BP      Systolic BP Percentile  Diastolic BP Percentile      Pulse      Resp      Temp      Temp src      SpO2      Weight      Height      Head Circumference      Peak Flow      Pain Score      Pain Loc      Pain Education      Exclude from Growth Chart    No data found.  Updated Vital Signs BP (!) 140/104 (BP Location: Left Arm)   Pulse 73   Temp 98 F (36.7 C) (Oral)   Resp 18   LMP 01/30/2024 (Exact Date)   SpO2 99%   Visual Acuity Right Eye Distance:   Left Eye Distance:   Bilateral Distance:    Right Eye Near:   Left Eye Near:    Bilateral Near:     Physical Exam Vitals and nursing note reviewed.  Constitutional:      General: She is not in acute distress.    Appearance: She is well-developed and well-groomed.  HENT:     Head: Normocephalic and atraumatic.  Eyes:     Conjunctiva/sclera: Conjunctivae normal.  Cardiovascular:     Rate and Rhythm: Normal rate and regular rhythm.     Heart sounds: Normal  heart sounds. No murmur heard. Pulmonary:     Effort: Pulmonary effort is normal. No respiratory distress.     Breath sounds: Normal breath sounds and air entry.  Abdominal:     Palpations: Abdomen is soft.     Tenderness: There is no abdominal tenderness.  Musculoskeletal:        General: No swelling.     Cervical back: Neck supple.  Skin:    General: Skin is warm and dry.     Capillary Refill: Capillary refill takes less than 2 seconds.  Neurological:     General: No focal deficit present.     Mental Status: She is alert and oriented to person, place, and time.     GCS: GCS eye subscore is 4. GCS verbal subscore is 5. GCS motor subscore is 6.  Psychiatric:        Attention and Perception: Attention normal.        Mood and Affect: Mood normal.        Speech: Speech normal.        Behavior: Behavior normal. Behavior is cooperative.      UC Treatments / Results  Labs (all labs ordered are listed, but only abnormal results are displayed) Labs Reviewed  CERVICOVAGINAL ANCILLARY ONLY    EKG   Radiology No results found.  Procedures Procedures (including critical care time)  Medications Ordered in UC Medications - No data to display  Initial Impression / Assessment and Plan / UC Course  I have reviewed the triage vital signs and the nursing notes.  Pertinent labs & imaging results that were available during my care of the patient were reviewed by me and considered in my medical decision making (see chart for details).    Discussed exam findings and plan of care with patient, pt awaiting results, advised pt we will treat accordingly once results known, safe sex practice and strict go to ER precautions given.   Patient verbalized understanding to this provider.  Ddx: Vaginal discharge, STI, BV,yeast, elevated blood pressure w dx of HTN Final Clinical Impressions(s) / UC Diagnoses  Final diagnoses:  Vaginal discharge  Screening examination for STD (sexually  transmitted disease)  Elevated blood pressure reading with diagnosis of hypertension     Discharge Instructions      Check my chart for results. Avoid sexual activity until results,treatment known and completed. Safe sex with all future sexual activity. We have sent testing for sexually transmitted infections. We will notify you of any positive results once they are received. If required, we will prescribe any medications you might need.   Follow up with PCP,recheck BP. Return as needed.     ED Prescriptions   None    PDMP not reviewed this encounter.   Clancy Gourd, NP 02/05/24 1542

## 2024-02-06 LAB — CERVICOVAGINAL ANCILLARY ONLY
Bacterial Vaginitis (gardnerella): POSITIVE — AB
Candida Glabrata: NEGATIVE
Candida Vaginitis: NEGATIVE
Chlamydia: NEGATIVE
Comment: NEGATIVE
Comment: NEGATIVE
Comment: NEGATIVE
Comment: NEGATIVE
Comment: NEGATIVE
Comment: NORMAL
Neisseria Gonorrhea: NEGATIVE
Trichomonas: POSITIVE — AB

## 2024-02-07 ENCOUNTER — Telehealth: Payer: Self-pay

## 2024-02-07 ENCOUNTER — Telehealth (HOSPITAL_COMMUNITY): Payer: Self-pay

## 2024-02-07 MED ORDER — METRONIDAZOLE 500 MG PO TABS: 500.0000 mg | ORAL_TABLET | Freq: Two times a day (BID) | ORAL | 0 refills | Status: AC

## 2024-02-07 NOTE — Telephone Encounter (Signed)
 Original Call/Information:  [employer]... MRN [id number] Wendy Miles Phone # [phone] . She called about a prescription she has not received, I told her I would let someone know to give her a call and maybe update or inform her of the results. She has now called for the 2nd time so if anyone could give her a call at the earliest convenience. thank you.   Reviewed the following:   Status: Edited Result - FINAL     Next appt: None   Test Result Released: Yes (seen)   0 Result Notes    Component Ref Range & Units (hover) 2 d ago  Neisseria Gonorrhea Negative  Chlamydia Negative  Trichomonas Positive Abnormal   Bacterial Vaginitis (gardnerella) Positive Abnormal   Candida Vaginitis Negative  Candida Glabrata Negative  Comment Normal Reference Range Bacterial Vaginosis - Negative  Comment Normal Reference Range Candida Species - Negative  Comment Normal Reference Range Candida Galbrata - Negative  Comment Normal Reference Range Trichomonas - Negative  Comment Normal Reference Ranger Chlamydia - Negative  Comment Normal Reference Range Neisseria Gonorrhea - Negative  Resulting [facility]        Specimen Collected: 02/05/24 14:48 Last Resulted: 02/06/24 14:36    Pharmacy: [facility] [address] [place]  Patient would like treatment as soon as possible.  Sent to provider in clinic today and will f-up with patient thereafter.  Arlys John, CMA

## 2024-02-07 NOTE — Telephone Encounter (Signed)
 Update:     Warren Danes, RN  Registered Nurse Specialty: Emergency Medicine   Telephone Encounter Signed   Encounter Date: 02/07/2024   Signed      Per protocol, pt requires tx with metronidazole. Reviewed with patient, verified pharmacy, prescription sent        Electronically signed by Warren Danes, RN at 02/07/2024 10:50 AM

## 2024-02-07 NOTE — Telephone Encounter (Signed)
 Per protocol, pt requires tx with metronidazole. Reviewed with patient, verified pharmacy, prescription sent.
